# Patient Record
Sex: Female | Born: 1960 | Race: White | Hispanic: No | Marital: Married | Smoking: Never smoker
Health system: Southern US, Community
[De-identification: ages and names within clinical notes are randomized; demographics above are authoritative.]

## PROBLEM LIST (undated history)

## (undated) DIAGNOSIS — F329 Major depressive disorder, single episode, unspecified: Secondary | ICD-10-CM

## (undated) DIAGNOSIS — G4730 Sleep apnea, unspecified: Secondary | ICD-10-CM

## (undated) DIAGNOSIS — F419 Anxiety disorder, unspecified: Secondary | ICD-10-CM

## (undated) DIAGNOSIS — F32A Depression, unspecified: Secondary | ICD-10-CM

## (undated) DIAGNOSIS — Z862 Personal history of diseases of the blood and blood-forming organs and certain disorders involving the immune mechanism: Secondary | ICD-10-CM

## (undated) DIAGNOSIS — Z9889 Other specified postprocedural states: Secondary | ICD-10-CM

## (undated) DIAGNOSIS — K5150 Left sided colitis without complications: Secondary | ICD-10-CM

## (undated) DIAGNOSIS — H3320 Serous retinal detachment, unspecified eye: Secondary | ICD-10-CM

## (undated) DIAGNOSIS — H269 Unspecified cataract: Secondary | ICD-10-CM

## (undated) DIAGNOSIS — Z86010 Personal history of colonic polyps: Secondary | ICD-10-CM

## (undated) DIAGNOSIS — T7840XA Allergy, unspecified, initial encounter: Secondary | ICD-10-CM

## (undated) DIAGNOSIS — D649 Anemia, unspecified: Secondary | ICD-10-CM

## (undated) DIAGNOSIS — I10 Essential (primary) hypertension: Secondary | ICD-10-CM

## (undated) DIAGNOSIS — M1990 Unspecified osteoarthritis, unspecified site: Secondary | ICD-10-CM

## (undated) DIAGNOSIS — T8859XA Other complications of anesthesia, initial encounter: Secondary | ICD-10-CM

## (undated) DIAGNOSIS — L309 Dermatitis, unspecified: Secondary | ICD-10-CM

## (undated) HISTORY — DX: Essential (primary) hypertension: I10

## (undated) HISTORY — DX: Sleep apnea, unspecified: G47.30

## (undated) HISTORY — DX: Unspecified osteoarthritis, unspecified site: M19.90

## (undated) HISTORY — DX: Dermatitis, unspecified: L30.9

## (undated) HISTORY — DX: Allergy, unspecified, initial encounter: T78.40XA

## (undated) HISTORY — PX: EYE SURGERY: SHX253

## (undated) HISTORY — DX: Unspecified cataract: H26.9

## (undated) HISTORY — DX: Left sided colitis without complications: K51.50

## (undated) HISTORY — DX: Anxiety disorder, unspecified: F41.9

## (undated) HISTORY — DX: Serous retinal detachment, unspecified eye: H33.20

## (undated) HISTORY — DX: Depression, unspecified: F32.A

## (undated) HISTORY — DX: Personal history of colonic polyps: Z86.010

## (undated) HISTORY — PX: COLONOSCOPY: SHX174

## (undated) HISTORY — DX: Personal history of diseases of the blood and blood-forming organs and certain disorders involving the immune mechanism: Z86.2

## (undated) HISTORY — DX: Major depressive disorder, single episode, unspecified: F32.9

## (undated) HISTORY — DX: Anemia, unspecified: D64.9

## (undated) HISTORY — PX: POLYPECTOMY: SHX149

---
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
## 2001-06-26 DIAGNOSIS — K5150 Left sided colitis without complications: Secondary | ICD-10-CM

## 2001-06-26 HISTORY — DX: Left sided colitis without complications: K51.50

## 2002-05-30 ENCOUNTER — Encounter: Payer: Self-pay | Admitting: Internal Medicine

## 2002-05-30 DIAGNOSIS — K5150 Left sided colitis without complications: Secondary | ICD-10-CM

## 2004-07-13 ENCOUNTER — Ambulatory Visit: Payer: Self-pay | Admitting: Internal Medicine

## 2004-07-22 ENCOUNTER — Ambulatory Visit: Payer: Self-pay | Admitting: Internal Medicine

## 2005-08-08 ENCOUNTER — Ambulatory Visit: Payer: Self-pay | Admitting: Internal Medicine

## 2006-07-31 ENCOUNTER — Ambulatory Visit: Payer: Self-pay | Admitting: Internal Medicine

## 2006-07-31 LAB — CONVERTED CEMR LAB
Basophils Relative: 0.8 % (ref 0.0–1.0)
Eosinophils Relative: 2.2 % (ref 0.0–5.0)
HCT: 35.7 % — ABNORMAL LOW (ref 36.0–46.0)
Hemoglobin: 12.6 g/dL (ref 12.0–15.0)
Lymphocytes Relative: 12.1 % (ref 12.0–46.0)
Monocytes Absolute: 0.7 10*3/uL (ref 0.2–0.7)
Neutro Abs: 7.4 10*3/uL (ref 1.4–7.7)
Neutrophils Relative %: 77.9 % — ABNORMAL HIGH (ref 43.0–77.0)
RDW: 12 % (ref 11.5–14.6)
WBC: 9.5 10*3/uL (ref 4.5–10.5)

## 2006-09-11 ENCOUNTER — Ambulatory Visit: Payer: Self-pay | Admitting: Internal Medicine

## 2007-09-20 DIAGNOSIS — M129 Arthropathy, unspecified: Secondary | ICD-10-CM | POA: Insufficient documentation

## 2007-09-20 DIAGNOSIS — J45909 Unspecified asthma, uncomplicated: Secondary | ICD-10-CM | POA: Insufficient documentation

## 2007-09-20 DIAGNOSIS — L259 Unspecified contact dermatitis, unspecified cause: Secondary | ICD-10-CM

## 2007-09-23 ENCOUNTER — Ambulatory Visit: Payer: Self-pay | Admitting: Internal Medicine

## 2008-07-15 ENCOUNTER — Encounter: Payer: Self-pay | Admitting: Internal Medicine

## 2008-10-26 ENCOUNTER — Telehealth: Payer: Self-pay | Admitting: Internal Medicine

## 2008-10-27 ENCOUNTER — Ambulatory Visit: Payer: Self-pay | Admitting: Internal Medicine

## 2008-10-29 LAB — CONVERTED CEMR LAB
Bilirubin Urine: NEGATIVE
Nitrite: NEGATIVE
Total Protein, Urine: NEGATIVE mg/dL
Urine Glucose: NEGATIVE mg/dL
pH: 6.5 (ref 5.0–8.0)

## 2008-11-06 ENCOUNTER — Telehealth: Payer: Self-pay | Admitting: Internal Medicine

## 2008-11-10 ENCOUNTER — Encounter: Payer: Self-pay | Admitting: Internal Medicine

## 2008-11-10 ENCOUNTER — Ambulatory Visit: Payer: Self-pay | Admitting: Internal Medicine

## 2008-11-10 HISTORY — PX: COLONOSCOPY: SHX5424

## 2008-11-14 ENCOUNTER — Encounter: Payer: Self-pay | Admitting: Internal Medicine

## 2009-02-10 ENCOUNTER — Ambulatory Visit: Payer: Self-pay | Admitting: Internal Medicine

## 2009-05-17 ENCOUNTER — Ambulatory Visit: Payer: Self-pay | Admitting: Internal Medicine

## 2009-10-05 ENCOUNTER — Telehealth: Payer: Self-pay | Admitting: Internal Medicine

## 2009-11-17 ENCOUNTER — Encounter (INDEPENDENT_AMBULATORY_CARE_PROVIDER_SITE_OTHER): Payer: Self-pay | Admitting: *Deleted

## 2010-02-04 ENCOUNTER — Encounter: Payer: Self-pay | Admitting: Internal Medicine

## 2010-02-04 LAB — CONVERTED CEMR LAB
Alkaline Phosphatase: 63 units/L
BUN: 23 mg/dL
Creatinine, Ser: 0.79 mg/dL
HCT: 40 %
TSH: 1.95 microintl units/mL
Total Bilirubin: 0.4 mg/dL

## 2010-05-09 ENCOUNTER — Ambulatory Visit: Payer: Self-pay | Admitting: Internal Medicine

## 2010-07-26 NOTE — Assessment & Plan Note (Signed)
Summary: yearly check up.////em    History of Present Illness Visit Type: Follow-up Visit Primary GI MD: Silvano Rusk MD [facility] Primary Provider: Kern Alberta, MD Chief Complaint: yearly f/u ulcerative colitis.  No current complaints. History of Present Illness:   50 yo ww with ulcerative colitis. Dx 2003. Had a flare last year and when she increased Asacol to 4.8 g/day and with transient Canasa has done well. no active symptoms from ulcerative colitis. No skin, joint, eye complaints except visual acuity decrease.   GI Review of Systems      Denies abdominal pain, acid reflux, belching, bloating, chest pain, dysphagia with liquids, dysphagia with solids, heartburn, loss of appetite, nausea, vomiting, vomiting blood, weight loss, and  weight gain.        Denies anal fissure, black tarry stools, change in bowel habit, constipation, diarrhea, diverticulosis, fecal incontinence, heme positive stool, hemorrhoids, irritable bowel syndrome, jaundice, light color stool, liver problems, rectal bleeding, and  rectal pain. Clinical Reports Reviewed:  Colonoscopy:  11/10/2008:  1) Left-sided ulcerative colitis with mild-moderate disease activity  BXS CONFIRM AND NO DYSPLASIA 2) Otherwise normal examination into to the terminal ileum BXS CONFIRM  Procedures Next Due Date:    Colonoscopy: 05/2012   Preventive Screening-Counseling & Management  Alcohol-Tobacco     Smoking Status: never     Smoking Cessation Counseling: no  Caffeine-Diet-Exercise     Diet Counseling: not indicated; diet is assessed to be healthy     Does Patient Exercise: yes     Type of exercise: walking  Comments: Lost 12# in 1 year with diet and exercise!    Current Medications (verified): 1)  Singulair 10 Mg Tabs (Montelukast Sodium) .... Take 1 Tab By Mouth At Bedtime 2)  Loratadine Allergy Relief 10 Mg Tbdp (Loratadine) .... Once Daily 3)  Multivitamins  Tabs (Multiple Vitamin) .... Take 1 Tablet By Mouth Once  A Day 4)  Calcium 600/vitamin D 600-400 Mg-Unit Tabs (Calcium Carbonate-Vitamin D) .... Take One By Mouth Two Times A Day 5)  Asacol 400 Mg Tbec (Mesalamine) .... Take 6 Tablets Two Times A Day 6)  Aleve 220 Mg Caps (Naproxen Sodium) .... As Needed 7)  Advair Diskus 100-50 Mcg/dose Misc (Fluticasone-Salmeterol) .... Two Times A Day 8)  Gas-X Extra Strength 125 Mg Caps (Simethicone) .... As Needed 9)  Canasa 1000 Mg Supp (Mesalamine) .... Insert One Suppository Into Rectum As Needed  Allergies (verified): 1)  Ceclor  Past History:  Past Medical History: Reviewed history from 10/27/2008 and no changes required. Allergic Rhinitis Depression Arthritis Asthma Ulcerative Colitis- Left sided dx 2003 Eczema  Past Surgical History: Reviewed history from 09/20/2007 and no changes required. Tonsillectomy Adenoidectomy  Family History: Family History of Colon Polyps:Father Family History of Diabetes: Father Family History of Heart Disease: Mother No FH of Colon Cancer: Family History of Breast Cancer: Maternal Grandmother  Social History: Occupation: Medical illustrator Patient has never smoked.  Two children, teen and adult. Married Alcohol Use - yes occ. Daily Caffeine Use -2 Illicit Drug Use - no Does Patient Exercise:  yes  Review of Systems       as per HPI  Vital Signs:  Patient profile:   50 year old female Height:      65 inches Weight:      168 pounds BMI:     28.06 BSA:     1.84 Pulse rate:   92 / minute Pulse rhythm:   regular BP sitting:   130 / 82  (  right arm)  Vitals Entered By: Madlyn Frankel CMA Deborra Medina) (May 09, 2010 8:33 AM)  Physical Exam  General:  Well developed, well nourished, no acute distress. Eyes:  no injection or icterus Mouth:  No deformity or lesions, dentition normal. Lungs:  Clear throughout to auscultation. Heart:  Regular rate and rhythm; no murmurs, rubs,  or bruits. Abdomen:  soft and nontender no HSM/mass BS+ Psych:   Alert and cooperative. Normal mood and affect.   Impression & Recommendations:  Problem # 1:  ULCERATIVE COLITIS, LEFT SIDED (ICD-556.5) Assessment Unchanged Diagnosed 2003, after prednisone initially has done well on Asacol, currently on 3.6 grams daily. CBC, CMET, TSH ok 01/2010 Colonoscopy 10/2008 with active disease (mild-mod changes) and Asacl increased to 4.8 g Canasa added 8/10 due to gas and looses stools. Using as needed and not now. She is doing well and will continue current therapy. We discussed pros/cons of follow-up endoscopic evaluation vs. using clinical signs and symptoms.  Have decided not to look at mucosa now. Routine screening colonoscopy 05/2012, sooner investigation for problems and could be flex sig.  Patient Instructions: 1)  Your Asacol was refilled for 1 year through [facility]. 2)  Please schedule a follow-up appointment in 1 year. 3)  Routine screening clonoscopy will be planned for 05/2012. 4)  Please schedule a follow-up appointment as needed for problems. 5)  Copy sent to : Amy Georgianne Fick, [facility] 6)  The medication list was reviewed and reconciled.  All changed / newly prescribed medications were explained.  A complete medication list was provided to the patient / caregiver.

## 2010-07-26 NOTE — Letter (Signed)
Summary: Colonoscopy-Changed to Office Visit Letter  [facility]  [address] [place], [place] [address]   Phone: [phone]  Fax: [phone]      Nov 17, 2009 MRN: [id number]   [name]  [address]   Dear [name],   According to our records, it is time for you to schedule a Colonoscopy. However, after reviewing your medical record, I feel that an office visit would be most appropriate to more completely evaluate you and determine your need for a repeat procedure.  Please call [phone] (option #2) at your convenience to schedule an office visit. If you have any questions, concerns, or feel that this letter is in error, we would appreciate your call.   Sincerely,  Gatha Mayer, M.D.  [facility] [phone]  Appended Document: Colonoscopy-Changed to Office Visit Letter Please see if you can contact her about following up I am thinking she may not need a full colonoscopy she does need a follow-up visit unless she is not coming back?  Appended Document: Colonoscopy-Changed to Office Visit Letter disregard she is coming 11/14  Appended Document: Colonoscopy-Changed to Office Visit Letter noted

## 2010-07-26 NOTE — Medication Information (Signed)
Summary: [facility]   Imported By: Bubba Hales 05/12/2010 09:28:11  _____________________________________________________________________  External Attachment:    Type:   Image     Comment:   External Document

## 2010-07-26 NOTE — Progress Notes (Signed)
Summary: returning call   Phone Note Call from Patient Call back at Home Phone [phone]   Caller: Patient Call For: Samarah Hogle Reason for Call: Refill Medication, Talk to Nurse Summary of Call: Patient states that she received a call from bcbs and our office for refills on her Asacol but she states that she does not need refills at this time. Initial call taken by: Ronalee Red,  October 05, 2009 3:15 PM  Follow-up for Phone Call        message left for pt to return call. Abelino Derrick CMA Deborra Medina)  October 05, 2009 3:25 PM   RC from pt..she is taking 12 tablets daily.  She will call when she needs refill.  Pt states pharmacy is requesting refill too early. Follow-up by: Abelino Derrick CMA Deborra Medina),  October 13, 2009 4:35 PM

## 2010-08-11 ENCOUNTER — Telehealth: Payer: Self-pay | Admitting: Internal Medicine

## 2010-08-17 NOTE — Progress Notes (Signed)
Summary: Medication   Phone Note Call from Patient Call back at [phone]   Caller: Patient Call For: Dr. Carlean Purl Reason for Call: Talk to Nurse Summary of Call: Needs a script for Asacol sent to pharmacy until she receives her script from [facility].......[facility] [phone] Initial call taken by: Webb Laws,  August 11, 2010 10:36 AM    Prescriptions: ASACOL 400 MG TBEC (MESALAMINE) Take 6 tablets two times a day  #1080 x 3   Entered by:   Randye Lobo NCMA   Authorized by:   Gatha Mayer MD, [facility]   Signed by:   Randye Lobo NCMA on 08/11/2010   Method used:   Electronically to        [facility] [address] (retail)       [address] [place]  [address]       Ph: [phone] or [phone]       Fax: [phone]   RxID:   [id number]

## 2010-09-16 ENCOUNTER — Other Ambulatory Visit: Payer: Self-pay | Admitting: *Deleted

## 2010-09-16 MED ORDER — MESALAMINE 400 MG PO TBEC: DELAYED_RELEASE_TABLET | ORAL | Status: DC

## 2010-11-08 NOTE — Assessment & Plan Note (Signed)
[facility] OFFICE NOTE   NAME:[name], Wisdom                       MRN:          [id number]  DATE:09/23/2007                            DOB:          06-Nov-1960    CHIEF COMPLAINT:  Follow up of left-sided ulcerative colitis.   PROBLEMS:  1. Left-sided ulcerative colitis diagnosed 2003, initial colonoscopy      then.  2. Asthma.  3. Depression on Wellbutrin.  4. Eczema.  5. Allergic rhinitis.  6. Arthralgia/osteoarthritis in the past.   PAST SURGICAL HISTORY:  Tonsillectomy and adenoidectomy.   INTERVAL HISTORY:  [name] is doing well without any significant abdominal  pain, diarrhea or rectal bleeding.  She continues on 9 Asacol a day, and  has not needed any other adjunct therapy in the interim since her last  year's visit.  She needs a refill on her Asacol.  She needs to go back  and have her cholesterol rechecked with Kassie Mends, PAC, because  apparently those numbers were up somewhat.  She has gained 10 pounds in  the last year.  She says her weight is up and down some.  She is  thinking about coming off the Effexor or changing to something  different.  She remains a non-smoker.  There is no significant change in  her social history reported at this time.   MEDICATIONS:  Remain Advair, Singulair, Clarinex, multivitamin,  Wellbutrin, calcium with vitamin D, Asacol as described.   SHE IS ALLERGIC TO CECLOR.   PRN MEDICATIONS:  Include Aleve and Canasa suppositories as needed.   PHYSICAL EXAMINATION:  Weight 191 pounds.  Pulse 68.  Blood pressure  112/76.  EYES:  Anicteric.  ABDOMEN:  Soft and nontender.  No organomegaly.  She is alert and oriented x3.   ASSESSMENT:  Left-sided ulcerative colitis, stable.   PLAN:  1. Refill Asacol for a year.  2. Return visit in a year.  3. Colonoscopy around December 2011 would be the earliest, somewhere      in that time frame, 8 to 10 years from her  diagnosis, otherwise at      age 51.  She has just had left-sided ulcerative colitis, so she      could actually go 10 to 15 years prior to the colonoscopy, probably      due around by the time she is 51.  4. I have asked her to look in to having her vitamin D level checked      with her primary care [name], and consider a bone density study      according to routine screening guidelines.  I do not think her left-      sided ulcerative colitis predisposes her significantly to      osteoporosis, and she has no significant steroid exposure in the      past.  In fact, she has had none.     Gatha Mayer, MD,FACG  Electronically Signed    CEG/MedQ  DD: 09/23/2007  DT: 09/23/2007  Job #: [id number]   cc:   Kassie Mends, P.A.C.

## 2010-11-11 NOTE — Assessment & Plan Note (Signed)
[name]   NAME:[name], [name]                       MRN:          [id number]  DATE:07/31/2006                            DOB:          1961-02-05    CHIEF COMPLAINT:  Followup of ulcerative colitis with flare.   She is having some rectal bleeding for the past few weeks.  Some loose  stools that are urgent and small volume.  There is bilateral lower  quadrant cramps.  She had 1 day maybe of some subjective fever and body  aches.  She thinks she may have had a gastroenteritis in the middle of  all this.  I do not think she vomited.  There is no genitourinary  complaint.  I had seen her a year ago, she has gained about 5-6 pounds  in the last year.   CURRENT MEDICATIONS:  Are listed and reviewed in the chart.  As far as  her left-sided ulcerative colitis she has been on the Asacol 6 tablets a  day, 3 in the morning, 3 in the evening.  She had used a few  suppositories, she had expired Rowasa enemas so she did not take those.  There is no rectal pain.   DRUG ALLERGIES:  CECLOR.   PAST MEDICAL HISTORY:  Reviewed and unchanged from previous, there is  nothing new.   PHYSICAL EXAMINATION:  Reveals a well developed, overweight, middle-aged  white woman in no acute distress.  ABDOMEN:  Soft, minimally tender in the lower quadrants without  organomegaly or mass.  Inspection of the rectal area shows no perianal rash or abnormalities.   ASSESSMENT:  Left-sided ulcerative colitis with flare.   PLAN:  1. Check CBC with diff, increase Asacol to 9 tablets a day, split      those, 3 month prescription printed for the patient.  2. Add Rowasa enema nightly for 2 weeks.  If that solves things,      switch over to Canasa suppository, if it does not, continue the      Rowasa until she follows up in 6 weeks.  Prescriptions for both      given.  3. She is to call back if things do not improve.     Gatha Mayer, MD,FACG  Electronically Signed    CEG/MedQ  DD: 07/31/2006  DT: 07/31/2006  Job #: [id number]

## 2010-11-11 NOTE — Assessment & Plan Note (Signed)
[facility] OFFICE NOTE   NAME:Marie, Combs                       MRN:          [id number]  DATE:09/11/2006                            DOB:          1961-04-17    CHIEF COMPLAINT:  Followup of ulcerative colitis.   She had had a flare. She increased her Asacol to 9 tablets a day taking  b.i.d. I put her on a Rowasa enema and then Canasa suppositories. No  more diarrhea, no more bleeding. She is using the Canasa suppositories  most nights but not all. See worksheet for other details. Her weight is  181 pounds, pulse 96, blood pressure 124/76.  Medications are listed and reviewed.   ASSESSMENT:  Left-sided ulcerative colitis under control at this time.   PLAN:  Continue Asacol at current dose and she can stop using the Canasa  and add back if she has bleeding or diarrhea. She should see me within 6-  12 months otherwise.     Gatha Mayer, MD,FACG  Electronically Signed    CEG/MedQ  DD: 09/12/2006  DT: 09/13/2006  Job #: [id number]   cc:   Kassie Mends, PA-C

## 2011-05-30 ENCOUNTER — Ambulatory Visit (INDEPENDENT_AMBULATORY_CARE_PROVIDER_SITE_OTHER): Payer: BC Managed Care – PPO | Admitting: Internal Medicine

## 2011-05-30 ENCOUNTER — Encounter: Payer: Self-pay | Admitting: Internal Medicine

## 2011-05-30 VITALS — BP 132/78 | HR 84 | Ht 65.0 in | Wt 172.0 lb

## 2011-05-30 DIAGNOSIS — K5150 Left sided colitis without complications: Secondary | ICD-10-CM

## 2011-05-30 MED ORDER — MESALAMINE 400 MG PO TBEC: DELAYED_RELEASE_TABLET | ORAL | Status: DC

## 2011-05-30 MED ORDER — MESALAMINE 1000 MG RE SUPP: 1000.0000 mg | RECTAL | Status: DC | PRN

## 2011-05-30 NOTE — Patient Instructions (Signed)
Your prescriptions have been sent to your maill order pharmacy for Canasa and Asacol. Dr. Carlean Purl will see you in 1 year at your Colonoscopy appointment. You will be contacted around November 2013 to schedule that.

## 2011-05-30 NOTE — Assessment & Plan Note (Signed)
Doing well, continue mesalamine 4.8 g total orally and intermittent rectal mesalamine in the form of Canasa as needed. Note that comprehensive metabolic panel and TSH from September 2012 are normal.

## 2011-05-30 NOTE — Progress Notes (Signed)
  Subjective:    Patient ID: [name], female    DOB: March 20, 1961, 50 y.o.   MRN: [id number]  HPI [name] presents today for routine followup of her left-sided ulcer colitis. Other than a little bit of diarrhea back in the summer, she has done well without significant diarrhea, rectal bleeding or abdominal pain on her current dose of mesalamine. She occasionally uses her Canasa suppositories. She had her primary care physician today things are going well there she brings metabolic panel, lipids and a TSH which are normal. Allergies  Allergen Reactions  . Cefaclor    Outpatient Prescriptions Prior to Visit  Medication Sig Dispense Refill  . Calcium Carbonate-Vitamin D (CALCIUM 600+D) 600-400 MG-UNIT per tablet Take 1 tablet by mouth 2 (two) times daily.        . Fluticasone-Salmeterol (ADVAIR) 100-50 MCG/DOSE AEPB Inhale 1 puff into the lungs 2 (two) times daily.        Marland Kitchen loratadine (LORATADINE ALLERGY RELIEF) 10 MG dissolvable tablet Take 10 mg by mouth daily.        . montelukast (SINGULAIR) 10 MG tablet Take 10 mg by mouth at bedtime.        . Multiple Vitamin (MULTIVITAMIN) tablet Take 1 tablet by mouth daily.        . Simethicone (GAS-X EXTRA STRENGTH) 125 MG CAPS Take by mouth as needed.        . mesalamine (ASACOL) 400 MG EC tablet Take six tablets by mouth two times a day  1080 tablet  2  . mesalamine (CANASA) 1000 MG suppository Place 1,000 mg rectally as needed.         Past Medical History  Diagnosis Date  . Allergic rhinitis   . Depression   . Arthritis   . Asthma   . Ulcerative colitis, left sided 2003  . Eczema   . History of iron deficiency anemia    Past Surgical History  Procedure Date  . Tonsillectomy and adenoidectomy   . Colonoscopy 11/10/2008    ulcerative colitis   History   Social History  . Marital Status: Married    Spouse Name: N/A    Number of Children: 2  . Years of Education: N/A   Occupational History  . insurance agent    Social  History Main Topics  . Smoking status: Never Smoker   . Smokeless tobacco: Never Used  . Alcohol Use: Yes     occ  . Drug Use: No  .                  Family History  Problem Relation Age of Onset  . Colon polyps Father   . Diabetes Father   . Heart disease Mother   . Breast cancer Maternal Grandmother          Review of Systems As above.    Objective:   Physical Exam General:  NAD Eyes: anicteric Lungs: clear Heart: S1S2 no rubs, murmurs or gallops Abdomen: soft and nontender, BS+ Ext: no edema          Assessment & Plan:

## 2011-12-25 ENCOUNTER — Other Ambulatory Visit: Payer: Self-pay | Admitting: Internal Medicine

## 2011-12-25 MED ORDER — MESALAMINE 400 MG PO CPDR: 2400.0000 mg | DELAYED_RELEASE_CAPSULE | Freq: Two times a day (BID) | ORAL | Status: DC

## 2011-12-25 NOTE — Telephone Encounter (Signed)
Pt request the replacement for Asacol be sent to [facility] in [place] for 3 mos. [place].  I informed her that Asacol is now Delzicol.

## 2012-02-13 ENCOUNTER — Telehealth: Payer: Self-pay | Admitting: Internal Medicine

## 2012-02-13 MED ORDER — MESALAMINE 400 MG PO CPDR: 2400.0000 mg | DELAYED_RELEASE_CAPSULE | Freq: Two times a day (BID) | ORAL | Status: DC

## 2012-02-13 NOTE — Telephone Encounter (Signed)
LM on pts cell# stating that her Delzicol has been sent in to the [facility] per her request.  Reminded her to call back and set up an appointment for Nov/Dec. 2013

## 2012-04-08 ENCOUNTER — Encounter: Payer: Self-pay | Admitting: Internal Medicine

## 2012-05-10 ENCOUNTER — Ambulatory Visit (AMBULATORY_SURGERY_CENTER): Payer: BC Managed Care – PPO | Admitting: *Deleted

## 2012-05-10 ENCOUNTER — Encounter: Payer: Self-pay | Admitting: Internal Medicine

## 2012-05-10 VITALS — Ht 65.0 in | Wt 175.0 lb

## 2012-05-10 DIAGNOSIS — K5150 Left sided colitis without complications: Secondary | ICD-10-CM

## 2012-05-10 DIAGNOSIS — Z1211 Encounter for screening for malignant neoplasm of colon: Secondary | ICD-10-CM

## 2012-05-10 MED ORDER — SUPREP BOWEL PREP KIT 17.5-3.13-1.6 GM/177ML PO SOLN: ORAL | Status: DC

## 2012-06-05 ENCOUNTER — Encounter: Payer: Self-pay | Admitting: Internal Medicine

## 2012-06-05 ENCOUNTER — Ambulatory Visit (AMBULATORY_SURGERY_CENTER): Payer: BC Managed Care – PPO | Admitting: Internal Medicine

## 2012-06-05 VITALS — BP 123/77 | HR 86 | Temp 97.9°F | Resp 12 | Ht 65.0 in | Wt 175.0 lb

## 2012-06-05 DIAGNOSIS — Z86010 Personal history of colonic polyps: Secondary | ICD-10-CM

## 2012-06-05 DIAGNOSIS — D126 Benign neoplasm of colon, unspecified: Secondary | ICD-10-CM

## 2012-06-05 DIAGNOSIS — Z1211 Encounter for screening for malignant neoplasm of colon: Secondary | ICD-10-CM

## 2012-06-05 DIAGNOSIS — K5190 Ulcerative colitis, unspecified, without complications: Secondary | ICD-10-CM

## 2012-06-05 DIAGNOSIS — Z860101 Personal history of adenomatous and serrated colon polyps: Secondary | ICD-10-CM

## 2012-06-05 DIAGNOSIS — K5150 Left sided colitis without complications: Secondary | ICD-10-CM

## 2012-06-05 HISTORY — DX: Personal history of adenomatous and serrated colon polyps: Z86.0101

## 2012-06-05 HISTORY — DX: Personal history of colonic polyps: Z86.010

## 2012-06-05 MED ORDER — SODIUM CHLORIDE 0.9 % IV SOLN: 500.0000 mL | INTRAVENOUS | Status: DC

## 2012-06-05 NOTE — Progress Notes (Signed)
Patient did not experience any of the following events: a burn prior to discharge; a fall within the facility; wrong site/side/patient/procedure/implant event; or a hospital transfer or hospital admission upon discharge from the facility. (G8907) Patient did not have preoperative order for IV antibiotic SSI prophylaxis. (G8918)  

## 2012-06-05 NOTE — Progress Notes (Signed)
Called to room to assist during endoscopic procedure.  Patient ID and intended procedure confirmed with present staff. Received instructions for my participation in the procedure from the performing physician.  

## 2012-06-05 NOTE — Patient Instructions (Addendum)
One small polyp was removed. It looks benign. Your colitis looks like it is under control. I will let you know biopsy results and plans. Please make sure to have your kidney function (BUN and creatinine) checked this month or next (primary care can do) since you take mesalamine and that can rarely damage the kidneys (extremely rare). An annual urinalysis is also appropriate. I would like you to have your primary care send me those results also.   Thank you for choosing me and [facility].  Gatha Mayer, MD, FACG  YOU HAD AN ENDOSCOPIC PROCEDURE TODAY AT [facility]: Refer to the procedure report that was given to you for any specific questions about what was found during the examination.  If the procedure report does not answer your questions, please call your gastroenterologist to clarify.  If you requested that your care partner not be given the details of your procedure findings, then the procedure report has been included in a sealed envelope for you to review at your convenience later.  YOU SHOULD EXPECT: Some feelings of bloating in the abdomen. Passage of more gas than usual.  Walking can help get rid of the air that was put into your GI tract during the procedure and reduce the bloating. If you had a lower endoscopy (such as a colonoscopy or flexible sigmoidoscopy) you may notice spotting of blood in your stool or on the toilet paper. If you underwent a bowel prep for your procedure, then you may not have a normal bowel movement for a few days.  DIET: Your first meal following the procedure should be a light meal and then it is ok to progress to your normal diet.  A half-sandwich or bowl of soup is an example of a good first meal.  Heavy or fried foods are harder to digest and may make you feel nauseous or bloated.  Likewise meals heavy in dairy and vegetables can cause extra gas to form and this can also increase the bloating.  Drink plenty of fluids but you  should avoid alcoholic beverages for 24 hours.  ACTIVITY: Your care partner should take you home directly after the procedure.  You should plan to take it easy, moving slowly for the rest of the day.  You can resume normal activity the day after the procedure however you should NOT DRIVE or use heavy machinery for 24 hours (because of the sedation medicines used during the test).    SYMPTOMS TO REPORT IMMEDIATELY: A gastroenterologist can be reached at any hour.  During normal business hours, 8:30 AM to 5:00 PM Monday through Friday, call [phone].  After hours and on weekends, please call the GI answering service at [phone] who will take a message and have the physician on call contact you.   Following lower endoscopy (colonoscopy or flexible sigmoidoscopy):  Excessive amounts of blood in the stool  Significant tenderness or worsening of abdominal pains  Swelling of the abdomen that is new, acute  Fever of 100F or higher   FOLLOW UP: If any biopsies were taken you will be contacted by phone or by letter within the next 1-3 weeks.  Call your gastroenterologist if you have not heard about the biopsies in 3 weeks.  Our staff will call the home number listed on your records the next business day following your procedure to check on you and address any questions or concerns that you may have at that time regarding the information given to you  following your procedure. This is a courtesy call and so if there is no answer at the home number and we have not heard from you through the emergency physician on call, we will assume that you have returned to your regular daily activities without incident.  SIGNATURES/CONFIDENTIALITY: You and/or your care partner have signed paperwork which will be entered into your electronic medical record.  These signatures attest to the fact that that the information above on your After Visit Summary has been reviewed and is understood.  Full responsibility  of the confidentiality of this discharge information lies with you and/or your care-partner.

## 2012-06-05 NOTE — Op Note (Signed)
[place]  [address] [place], [id number]   COLONOSCOPY PROCEDURE REPORT  PATIENT: [name], [name]  MR#: [id number] BIRTHDATE: [birth year] , 51  yrs. old GENDER: Female ENDOSCOPIST: Gatha Mayer, MD, [facility] PROCEDURE DATE:  06/05/2012 PROCEDURE:   Colonoscopy with snare polypectomy and Colonoscopy with biopsy ASA CLASS:   Class II INDICATIONS:elevated risk screening. MEDICATIONS: MAC sedation, administered by CRNA, These medications were titrated to patient response per physician's verbal order, and propofol (Diprivan) 495m IV  DESCRIPTION OF PROCEDURE:   After the risks benefits and alternatives of the procedure were thoroughly explained, informed consent was obtained.  A digital rectal exam revealed no abnormalities of the rectum.   The LB [id number]-H180AL [id number] endoscope was introduced through the anus and advanced to the cecum, which was identified by both the appendix and ileocecal valve. No adverse events experienced.   The quality of the prep was Suprep excellent The instrument was then slowly withdrawn as the colon was fully examined.      COLON FINDINGS: A diminutive polypoid shaped sessile polyp was found at the appendiceal orifice.  A polypectomy was performed with a cold snare.  The resection was complete and the polyp tissue was completely retrieved.   Mild erythematous was found in the sigmoid colon.  Multiple biopsies were performed using cold forceps.   The colon mucosa was otherwise normal.  Retroflexed views revealed internal hemorrhoids. The time to cecum=4 minutes 15 seconds. Withdrawal time=12 minutes 42 seconds.  The scope was withdrawn and the procedure completed. COMPLICATIONS: There were no complications.  ENDOSCOPIC IMPRESSION: 1.   Diminutive sessile polyp was found at the appendiceal orifice; polypectomy was performed with a cold snare 2.   Mild erythematous was found in the sigmoid colon; multiple biopsies were performed  using cold forceps as part of UC screening 3.   The colon mucosa was otherwise normal - left colon biopsies taken. UC looks in remission.  RECOMMENDATIONS: Timing of repeat colonoscopy will be determined by pathology findings. Need to see results of annual BUN/creatinine (CMET) and UA from PCP Continue mesalamine 4.8 g daily   eSigned:  CGatha Mayer MD, [facility]12/04/2012 10:36 AM   cc: The Patient and TGar Ponto MD

## 2012-06-06 ENCOUNTER — Telehealth: Payer: Self-pay | Admitting: *Deleted

## 2012-06-06 NOTE — Telephone Encounter (Signed)
  Follow up Call-  Call back number 06/05/2012  Post procedure Call Back phone  # [phone]  Permission to leave phone message Yes     Patient questions:  Do you have a fever, pain , or abdominal swelling? no Pain Score  0 *  Have you tolerated food without any problems? yes  Have you been able to return to your normal activities? yes  Do you have any questions about your discharge instructions: Diet   no Medications  no Follow up visit  no  Do you have questions or concerns about your Care? no  Actions: * If pain score is 4 or above: No action needed, pain <4.

## 2012-06-11 ENCOUNTER — Encounter: Payer: Self-pay | Admitting: Internal Medicine

## 2012-06-11 NOTE — Progress Notes (Signed)
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

## 2012-10-28 ENCOUNTER — Other Ambulatory Visit: Payer: Self-pay | Admitting: Internal Medicine

## 2013-05-26 HISTORY — PX: CATARACT EXTRACTION: SUR2

## 2013-10-24 ENCOUNTER — Other Ambulatory Visit: Payer: Self-pay | Admitting: Internal Medicine

## 2014-03-29 ENCOUNTER — Other Ambulatory Visit: Payer: Self-pay | Admitting: Internal Medicine

## 2014-07-11 ENCOUNTER — Other Ambulatory Visit: Payer: Self-pay | Admitting: Internal Medicine

## 2014-07-12 MED ORDER — MESALAMINE 400 MG PO CPDR: DELAYED_RELEASE_CAPSULE | ORAL | Status: DC

## 2014-08-31 ENCOUNTER — Ambulatory Visit: Payer: Self-pay | Admitting: Internal Medicine

## 2014-09-21 ENCOUNTER — Other Ambulatory Visit: Payer: Self-pay | Admitting: Internal Medicine

## 2014-10-08 ENCOUNTER — Ambulatory Visit: Payer: Self-pay | Admitting: Internal Medicine

## 2014-11-16 ENCOUNTER — Ambulatory Visit (INDEPENDENT_AMBULATORY_CARE_PROVIDER_SITE_OTHER): Payer: BLUE CROSS/BLUE SHIELD | Admitting: Internal Medicine

## 2014-11-16 ENCOUNTER — Encounter: Payer: Self-pay | Admitting: Internal Medicine

## 2014-11-16 VITALS — BP 142/92 | HR 76 | Ht 64.5 in | Wt 199.0 lb

## 2014-11-16 DIAGNOSIS — K5150 Left sided colitis without complications: Secondary | ICD-10-CM | POA: Diagnosis not present

## 2014-11-16 DIAGNOSIS — K602 Anal fissure, unspecified: Secondary | ICD-10-CM

## 2014-11-16 DIAGNOSIS — Z86010 Personal history of colonic polyps: Secondary | ICD-10-CM

## 2014-11-16 MED ORDER — BUDESONIDE 3 MG PO CPEP: 9.0000 mg | ORAL_CAPSULE | Freq: Every day | ORAL | Status: DC

## 2014-11-16 MED ORDER — DILTIAZEM GEL 2 %: 1.0000 "application " | Freq: Two times a day (BID) | CUTANEOUS | Status: DC

## 2014-11-16 NOTE — Progress Notes (Signed)
   Subjective:    Patient ID: [name], female    DOB: [birth year], 54 y.o.   MRN: [id number] Cc: ulcerative coituis HPI Very nice woman followed for UC - left siided. Maintained on mesalamine. Suffered loss of daughter age early 11's last Fall and has had some cramps, diarrhea, mucous and slight bleeding. Gradually getting better emotionally but still struggling. Has gained weight. Having some pain with and after defecation also, x few months.   Medications, allergies, past medical history, past surgical history, family history and social history are reviewed and updated in the EMR.  Review of Systems As above    Objective:   Physical Exam @BP  142/92 mmHg  Pulse 76  Ht 5' 4.5" (1.638 m)  Wt 199 lb (90.266 kg)  BMI 33.64 kg/m2  LMP 05/18/2014@  General:  NAD Eyes:   anicteric Lungs:  clear Heart::  S1S2 no rubs, murmurs or gallops Abdomen:  soft and nontender, BS+ Ext:   no edema, cyanosis or clubbing   Rectal: Patti Martinique, CMA present.  Anoderm inspection revealed small tags No mass or rectocele present. Mildly tender posterior with induration c/w fissure + moderate spasm.    Data Reviewed:  CBC ,CMET, Lipids, TSH from PCP - 11/03/14 - all ok except BUN 24, glucose 103     Assessment & Plan:    ULCERATIVE COLITIS, LEFT SIDED Try Entocort for 2 months to see if improves sxs Uceris not on formulary Reassess 2 months Colonoscopy will be due 2016-17 (late - early)   Anal fissure Etiology reviewed Control diarrhea/UC Diltiazem gel Local care RTC 2 months   Hx of adenomatous polyp of colon Colonoscopy late 2016, early 2017   WT:UUEKCM, TERRY, MD

## 2014-11-16 NOTE — Patient Instructions (Addendum)
We have sent the following medications to your pharmacy for you to pick up at your convenience: Generic Entocort  We are sending Diltiazem gel to [facility] in the [employer].    Follow up with [place] in 2 months, appointment made for 01/18/15 at 8:45AM.    I appreciate the opportunity to care for you. Silvano Rusk, M.D., [facility]

## 2014-11-16 NOTE — Assessment & Plan Note (Addendum)
Try Entocort for 2 months to see if improves sxs Uceris not on formulary Reassess 2 months Colonoscopy will be due 2016-17 (late - early)

## 2014-11-18 ENCOUNTER — Encounter: Payer: Self-pay | Admitting: Internal Medicine

## 2014-11-18 DIAGNOSIS — K602 Anal fissure, unspecified: Secondary | ICD-10-CM | POA: Insufficient documentation

## 2014-11-18 NOTE — Assessment & Plan Note (Signed)
Etiology reviewed Control diarrhea/UC Diltiazem gel Local care RTC 2 months

## 2014-11-18 NOTE — Assessment & Plan Note (Signed)
Colonoscopy late 2016, early 2017

## 2014-12-14 ENCOUNTER — Encounter: Payer: Self-pay | Admitting: Internal Medicine

## 2014-12-19 ENCOUNTER — Other Ambulatory Visit: Payer: Self-pay | Admitting: Internal Medicine

## 2015-01-18 ENCOUNTER — Encounter: Payer: Self-pay | Admitting: Internal Medicine

## 2015-01-18 ENCOUNTER — Ambulatory Visit (INDEPENDENT_AMBULATORY_CARE_PROVIDER_SITE_OTHER): Payer: BLUE CROSS/BLUE SHIELD | Admitting: Internal Medicine

## 2015-01-18 VITALS — BP 130/86 | HR 82 | Ht 65.0 in | Wt 205.6 lb

## 2015-01-18 DIAGNOSIS — K5150 Left sided colitis without complications: Secondary | ICD-10-CM | POA: Diagnosis not present

## 2015-01-18 DIAGNOSIS — K602 Anal fissure, unspecified: Secondary | ICD-10-CM | POA: Diagnosis not present

## 2015-01-18 NOTE — Progress Notes (Signed)
   Subjective:    Patient ID: [name], female    DOB: October 01, 1960, 54 y.o.   MRN: [id number] Chief complaint: Follow-up anal fissure and also of colitis HPI  The patient is here, her diarrhea problems she was having a couple months ago her gone. She is off the budesonide after 2 months of treatment. 9 mg daily. Anal fissure is much better as well she is using the diltiazem gel though not every day twice a day she has used it and is refilling it. Overall considerably better and she would like to schedule follow-up colonoscopy as we discussed in May. Medications, allergies, past medical history, past surgical history, family history and social history are reviewed and updated in the EMR.  Review of Systems As above    Objective:   Physical Exam BP 130/86 mmHg  Pulse 82  Ht 5' 5"  (1.651 m)  Wt 205 lb 9.6 oz (93.26 kg)  BMI 34.21 kg/m2 No acute distress Inspection of the anal area shows a small anterior tag but no evidence of fissure or other problems. June McMurray CMA present.      Assessment & Plan:  ULCERATIVE COLITIS, LEFT SIDED Time to reassess w/ colonoscopy DC budesonide  Anal fissure Symptomatically improved  The risks and benefits as well as alternatives of endoscopic procedure(s) have been discussed and reviewed. All questions answered. The patient agrees to proceed.   Current outpatient prescriptions:  .  albuterol (PROVENTIL HFA;VENTOLIN HFA) 108 (90 BASE) MCG/ACT inhaler, Inhale 2 puffs into the lungs every 6 (six) hours as needed. , Disp: , Rfl:  .  Calcium Carbonate-Vitamin D (CALCIUM 600+D) 600-400 MG-UNIT per tablet, Take 1 tablet by mouth 2 (two) times daily.  , Disp: , Rfl:  .  diltiazem 2 % GEL, Apply 1 application topically 2 (two) times daily., Disp: 30 g, Rfl: 2 .  Fluticasone-Salmeterol (ADVAIR) 100-50 MCG/DOSE AEPB, Inhale 1 puff into the lungs 2 (two) times daily.  , Disp: , Rfl:  .  loratadine (LORATADINE ALLERGY RELIEF) 10 MG dissolvable tablet,  Take 10 mg by mouth daily.  , Disp: , Rfl:  .  mesalamine (CANASA) 1000 MG suppository, Place 1 suppository (1,000 mg total) rectally as needed., Disp: 42 suppository, Rfl: 3 .  Mesalamine (DELZICOL) 400 MG CPDR DR capsule, Take 6 capsules twice a day, Disp: 1080 capsule, Rfl: 0 .  montelukast (SINGULAIR) 10 MG tablet, Take 10 mg by mouth at bedtime.  , Disp: , Rfl:  .  Multiple Vitamin (MULTIVITAMIN) tablet, Take 1 tablet by mouth daily.  , Disp: , Rfl:  .  Simethicone (GAS-X EXTRA STRENGTH) 125 MG CAPS, Take by mouth as needed.  , Disp: , Rfl:  .  clarithromycin (BIAXIN) 500 MG tablet, , Disp: , Rfl:  .  sertraline (ZOLOFT) 50 MG tablet, Take 1.5 tablets by mouth daily., Disp: , Rfl:   I appreciate the opportunity to care for this patient. CC: Gar Ponto, MD

## 2015-01-18 NOTE — Assessment & Plan Note (Signed)
Symptomatically improved

## 2015-01-18 NOTE — Patient Instructions (Signed)
You have been scheduled for a colonoscopy. Please follow written instructions given to you at your visit today.  Please pick up your prep supplies at the pharmacy within the next 1-3 days. If you use inhalers (even only as needed), please bring them with you on the day of your procedure.  Please discontinue taking Budesonide  I appreciate the opportunity to care for you. Gatha Mayer, MD, [facility]

## 2015-01-18 NOTE — Assessment & Plan Note (Signed)
Time to reassess w/ colonoscopy DC budesonide

## 2015-03-19 ENCOUNTER — Other Ambulatory Visit: Payer: Self-pay | Admitting: Internal Medicine

## 2015-03-29 ENCOUNTER — Encounter: Payer: BLUE CROSS/BLUE SHIELD | Admitting: Internal Medicine

## 2015-05-17 ENCOUNTER — Encounter: Payer: Self-pay | Admitting: Internal Medicine

## 2015-05-24 ENCOUNTER — Encounter: Payer: Self-pay | Admitting: Internal Medicine

## 2015-05-24 ENCOUNTER — Ambulatory Visit (AMBULATORY_SURGERY_CENTER): Payer: BLUE CROSS/BLUE SHIELD | Admitting: Internal Medicine

## 2015-05-24 VITALS — BP 151/91 | HR 74 | Temp 97.8°F | Resp 16 | Ht 65.0 in | Wt 205.0 lb

## 2015-05-24 DIAGNOSIS — K5150 Left sided colitis without complications: Secondary | ICD-10-CM | POA: Diagnosis present

## 2015-05-24 DIAGNOSIS — Z86010 Personal history of colonic polyps: Secondary | ICD-10-CM | POA: Diagnosis not present

## 2015-05-24 DIAGNOSIS — K529 Noninfective gastroenteritis and colitis, unspecified: Secondary | ICD-10-CM | POA: Diagnosis not present

## 2015-05-24 MED ORDER — SODIUM CHLORIDE 0.9 % IV SOLN: 500.0000 mL | INTRAVENOUS | Status: DC

## 2015-05-24 NOTE — Progress Notes (Signed)
Called to room to assist during endoscopic procedure.  Patient ID and intended procedure confirmed with present staff. Received instructions for my participation in the procedure from the performing physician.  

## 2015-05-24 NOTE — Patient Instructions (Addendum)
There was some mild inflammation in the rectum and sigmoid colon - nothing bad or suspicious.  I took biopsies and will let you know.  I think you should start back on the [place].  I appreciate the opportunity to care for you. Gatha Mayer, MD, [facility]     Discharge instructions given. Biopsies taken. Resume previous medications. YOU HAD AN ENDOSCOPIC PROCEDURE TODAY AT [facility]:   Refer to the procedure report that was given to you for any specific questions about what was found during the examination.  If the procedure report does not answer your questions, please call your gastroenterologist to clarify.  If you requested that your care partner not be given the details of your procedure findings, then the procedure report has been included in a sealed envelope for you to review at your convenience later.  YOU SHOULD EXPECT: Some feelings of bloating in the abdomen. Passage of more gas than usual.  Walking can help get rid of the air that was put into your GI tract during the procedure and reduce the bloating. If you had a lower endoscopy (such as a colonoscopy or flexible sigmoidoscopy) you may notice spotting of blood in your stool or on the toilet paper. If you underwent a bowel prep for your procedure, you may not have a normal bowel movement for a few days.  Please Note:  You might notice some irritation and congestion in your nose or some drainage.  This is from the oxygen used during your procedure.  There is no need for concern and it should clear up in a day or so.  SYMPTOMS TO REPORT IMMEDIATELY:   Following lower endoscopy (colonoscopy or flexible sigmoidoscopy):  Excessive amounts of blood in the stool  Significant tenderness or worsening of abdominal pains  Swelling of the abdomen that is new, acute  Fever of 100F or higher   For urgent or emergent issues, a gastroenterologist can be reached at any hour by calling [phone].   DIET:  Your first meal following the procedure should be a small meal and then it is ok to progress to your normal diet. Heavy or fried foods are harder to digest and may make you feel nauseous or bloated.  Likewise, meals heavy in dairy and vegetables can increase bloating.  Drink plenty of fluids but you should avoid alcoholic beverages for 24 hours.  ACTIVITY:  You should plan to take it easy for the rest of today and you should NOT DRIVE or use heavy machinery until tomorrow (because of the sedation medicines used during the test).    FOLLOW UP: Our staff will call the number listed on your records the next business day following your procedure to check on you and address any questions or concerns that you may have regarding the information given to you following your procedure. If we do not reach you, we will leave a message.  However, if you are feeling well and you are not experiencing any problems, there is no need to return our call.  We will assume that you have returned to your regular daily activities without incident.  If any biopsies were taken you will be contacted by phone or by letter within the next 1-3 weeks.  Please call us at [phone] if you have not heard about the biopsies in 3 weeks.    SIGNATURES/CONFIDENTIALITY: You and/or your care partner have signed paperwork which will be entered into your electronic medical record.  These  signatures attest to the fact that that the information above on your After Visit Summary has been reviewed and is understood.  Full responsibility of the confidentiality of this discharge information lies with you and/or your care-partner.

## 2015-05-24 NOTE — Op Note (Signed)
[place]  [address] [place], [id number]   COLONOSCOPY PROCEDURE REPORT  PATIENT: [name], [name]  MR#: [id number] BIRTHDATE: 1961/04/26 , 54  yrs. old GENDER: female ENDOSCOPIST: Gatha Mayer, MD, [facility] PROCEDURE DATE:  05/24/2015 PROCEDURE:   Colonoscopy, surveillance and Colonoscopy with biopsy First Screening Colonoscopy - Avg.  risk and is 50 yrs.  old or older - No.  Prior Negative Screening - Now for repeat screening. N/A  History of Adenoma - Now for follow-up colonoscopy & has been > or = to 3 yrs.  Yes hx of adenoma.  Has been 3 or more years since last colonoscopy.  Polyps removed today? No Recommend repeat exam, <10 yrs? Yes high risk ASA CLASS:   Class II INDICATIONS:Surveillance due to prior colonic neoplasia, Inflammatory bowel disease of the intestine if more precise diagnosis or determination of the extent / severity of activity of disease will influence immediate / future management, PH Colon Adenoma, and Inflammatory Bowel Disease (at least 8 years pancolitis or 15 years left sided colitis). MEDICATIONS: Propofol 500 mg IV and Monitored anesthesia care  DESCRIPTION OF PROCEDURE:   After the risks benefits and alternatives of the procedure were thoroughly explained, informed consent was obtained.  The digital rectal exam revealed no abnormalities of the rectum.   The LB [id number]  endoscope was introduced through the anus and advanced to the cecum, which was identified by both the appendix and ileocecal valve. No adverse events experienced.   The quality of the prep was excellent. (MiraLax was used)  The instrument was then slowly withdrawn as the colon was fully examined. Estimated blood loss is zero unless otherwise noted in this procedure report.  COLON FINDINGS: The colon was redundant.  Manual abdominal counter-pressure was used to reach the cecum.   Mild patchy proctitis and colitis rectum - 30 cm.  Biopsied.   The  examination was otherwise normal.  Left colon surveillance biopises taken. Retroflexion was not performed due to a narrow rectal vault. The time to cecum = 10.8 Withdrawal time = 10.6   The scope was withdrawn and the procedure completed. COMPLICATIONS: There were no immediate complications.  ENDOSCOPIC IMPRESSION: 1.   The colon was redundant 2.   Mild patchy proctitis and colitis rectum - 30 cm.  Biopsied 3.   The examination was otherwise normal - excellent prep - hx Left UC and adenoma (2013)  RECOMMENDATIONS: Timing of repeat colonoscopy will be determined by pathology findings.  She should restart Canasa suppositories.  eSigned:  Gatha Mayer, MD, [facility] 05/24/2015 8:40 AM   cc: Gar Ponto and The Patient

## 2015-05-24 NOTE — Progress Notes (Signed)
Stable to RR 

## 2015-05-24 NOTE — Progress Notes (Signed)
Reported to Charleston Ropes, CRNA that pt's blood pressure was 169/101 on left arm and retook it on right arm 157/106.  Pt said she does not take blood pressure meds and it is not normally high.  Pt has albuteral at bedside with lable applied to it. maw

## 2015-05-25 ENCOUNTER — Telehealth: Payer: Self-pay | Admitting: *Deleted

## 2015-05-25 NOTE — Telephone Encounter (Signed)
  Follow up Call-  Call back number 05/24/2015  Post procedure Call Back phone  # [phone] cell  Permission to leave phone message Yes     Patient questions:  Patient's mailbox is full. Unable to leave message.

## 2015-06-01 ENCOUNTER — Encounter: Payer: Self-pay | Admitting: Internal Medicine

## 2015-06-01 DIAGNOSIS — K5150 Left sided colitis without complications: Secondary | ICD-10-CM

## 2015-06-27 ENCOUNTER — Other Ambulatory Visit: Payer: Self-pay | Admitting: Internal Medicine

## 2015-06-27 HISTORY — PX: CARPAL TUNNEL RELEASE: SHX101

## 2015-07-01 ENCOUNTER — Telehealth: Payer: Self-pay

## 2015-07-01 NOTE — Telephone Encounter (Signed)
Received prior authorization on Delzicol 423m capsules, she takes 6 caps twice a day.  This exceeds the plan's maximum daily dose.  Will route to Dr GCarlean Purlto confirm dose.

## 2015-07-01 NOTE — Telephone Encounter (Signed)
Make the Rx 4 caps tid but she can take yit 6 bid - it is usu a tid med but many take it bid

## 2015-07-02 NOTE — Telephone Encounter (Signed)
After looking at the form that [facility] sent , Dr Carlean Purl has decided that we do the prior authorization for patient's Delzicol.

## 2015-07-02 NOTE — Telephone Encounter (Signed)
Spoke with [name] at [employer] # [phone] and started the prior authorization over the phone for the delzicol 426m caps. Need quantity over ride.  Answered questions, dx:  U.C., left sided  , K51.50.  The case # [id number] being sent for review and we will be notified with approval/denial.

## 2015-07-05 NOTE — Telephone Encounter (Signed)
We received fax stating that the delzicol is approved from 07/02/2015-07/04/2016.  Will send letter to be scanned in.

## 2015-12-14 ENCOUNTER — Other Ambulatory Visit: Payer: Self-pay

## 2015-12-14 MED ORDER — MESALAMINE 400 MG PO CPDR: DELAYED_RELEASE_CAPSULE | ORAL | Status: DC

## 2015-12-14 NOTE — Telephone Encounter (Signed)
Refill for delzicol sent in as requested.

## 2016-06-18 ENCOUNTER — Other Ambulatory Visit: Payer: Self-pay | Admitting: Internal Medicine

## 2017-05-24 ENCOUNTER — Telehealth: Payer: Self-pay | Admitting: Internal Medicine

## 2017-05-24 NOTE — Telephone Encounter (Signed)
Her insurance covers the following medicines: Balsalazide 750 mg capsules Sulfasalazine 548m tablets Sulfasalazine DR 5078mtablets  They told her these would be $25.00 for 3 months worth.  Will route to Dr GeCarlean Purlo advise which one he wants her to take.  She does like to get 3 months worth at a time thru [facility].

## 2017-05-24 NOTE — Telephone Encounter (Signed)
Spoke with [name]  And she will call her insurance company and get names of what is covered and call us back.  She has several weeks of her Delizcol left.

## 2017-05-25 MED ORDER — BALSALAZIDE DISODIUM 750 MG PO CAPS: 2250.0000 mg | ORAL_CAPSULE | Freq: Three times a day (TID) | ORAL | 3 refills | Status: DC

## 2017-05-25 NOTE — Telephone Encounter (Signed)
Balsalazide 2.25 g tid   # 810 -  750 mg capsules w/ 3 RF

## 2017-05-25 NOTE — Telephone Encounter (Signed)
  Spoke with [name] and told her which drug we are going to send in. Sent to [employer].

## 2017-07-11 ENCOUNTER — Encounter: Payer: Self-pay | Admitting: Internal Medicine

## 2017-12-03 ENCOUNTER — Encounter (HOSPITAL_COMMUNITY): Payer: Self-pay | Admitting: Certified Registered"

## 2017-12-03 ENCOUNTER — Encounter (INDEPENDENT_AMBULATORY_CARE_PROVIDER_SITE_OTHER): Payer: BLUE CROSS/BLUE SHIELD | Admitting: Ophthalmology

## 2017-12-03 ENCOUNTER — Ambulatory Visit: Admit: 2017-12-03 | Payer: BLUE CROSS/BLUE SHIELD | Admitting: Ophthalmology

## 2017-12-03 DIAGNOSIS — H35411 Lattice degeneration of retina, right eye: Secondary | ICD-10-CM

## 2017-12-03 DIAGNOSIS — H43813 Vitreous degeneration, bilateral: Secondary | ICD-10-CM

## 2017-12-03 DIAGNOSIS — H2511 Age-related nuclear cataract, right eye: Secondary | ICD-10-CM

## 2017-12-03 DIAGNOSIS — H338 Other retinal detachments: Secondary | ICD-10-CM

## 2017-12-03 DIAGNOSIS — I10 Essential (primary) hypertension: Secondary | ICD-10-CM | POA: Diagnosis not present

## 2017-12-03 DIAGNOSIS — H35033 Hypertensive retinopathy, bilateral: Secondary | ICD-10-CM | POA: Diagnosis not present

## 2017-12-03 SURGERY — SCLERAL BUCKLE
Anesthesia: General | Laterality: Right

## 2017-12-03 NOTE — Progress Notes (Signed)
[facility] Note  12/04/2017     CHIEF COMPLAINT Patient presents for Retina Evaluation   HISTORY OF PRESENT ILLNESS: [name] is a 57 y.o. female who presents to the clinic today for:   HPI    Retina Evaluation    In both eyes.  This started 2 weeks ago.  Associated Symptoms Blind Spot, Flashes and Floaters.  Negative for Glare, Shoulder/Hip pain, Fatigue, Jaw Claudication, Photophobia, Pain, Trauma, Fever, Weight Loss, Scalp Tenderness, Redness and Distortion.  Context:  distance vision, mid-range vision and near vision.  Treatments tried include surgery.  Response to treatment was moderate improvement.  I, the attending physician,  performed the HPI with the patient and updated documentation appropriately.          Comments    Referral of Dr. Zigmund Daniel for retina eval. Patient states appx two weeks ago she had flashes OD,it disappeared and returned with her not being able to see out of lower part of OD. Pt reports she had cataract sx 05/2013 os. Denies eye gtt's, is taking multivitamin.       Last edited by Bernarda Caffey, MD on 12/04/2017  1:00 PM. (History)    Pt states the Saturday of Memorial day weekend (x2 weeks ago) she saw a single flash OD; Pt states she then saw a spider web OD; Pt states those were the only "symptoms"; pt states the following Monday she went to [place] office and was told "everything was okay"; Pt states this past weekend she began to see a shadow coming in from nasal side; Pt states she went back to see [facility] on Monday and was sent to see Dr. Zigmund Daniel; (Mac off Saturday/Sunday -  09/08 - 09/09);   Referring physician: Caryl Bis, MD [place], [place] [address]  HISTORICAL INFORMATION:   Selected notes from the MEDICAL RECORD NUMBER Referred by Dr. Zigmund Daniel for Superior RD OD;  LEE- 06.10.19 (JDM) [BCVA OD: 20/LP OD: 20/20]  Ocular Hx- pseudophakia OS (05/2013), cataract OD, superior RD - break within  lattice, HTN ret OU, vitreous opacities OU PMH- arthritis, sleep apnea, ulcerative colitis     CURRENT MEDICATIONS: Current Outpatient Medications (Ophthalmic Drugs)  Medication Sig  . LOTEMAX 0.5 % ophthalmic suspension INSTILL 1 DROP INTO LEFT EYE 4 TIMES A DAY  . neomycin-polymyxin-dexameth (MAXITROL) 0.1 % OINT Place 1 application into the right eye 4 (four) times daily.  . prednisoLONE acetate (PRED FORTE) 1 % ophthalmic suspension Place 1 drop into the right eye 4 (four) times daily.   No current facility-administered medications for this visit.  (Ophthalmic Drugs)   Current Outpatient Medications (Other)  Medication Sig  . albuterol (PROVENTIL HFA;VENTOLIN HFA) 108 (90 BASE) MCG/ACT inhaler Inhale 2 puffs into the lungs every 6 (six) hours as needed.   . balsalazide (COLAZAL) 750 MG capsule Take 3 capsules (2,250 mg total) by mouth 3 (three) times daily.  . Calcium Carbonate-Vitamin D (CALCIUM 600+D) 600-400 MG-UNIT per tablet Take 1 tablet by mouth 2 (two) times daily.    Marland Kitchen diltiazem 2 % GEL Apply 1 application topically 2 (two) times daily.  . Fluticasone-Salmeterol (ADVAIR) 100-50 MCG/DOSE AEPB Inhale 1 puff into the lungs 2 (two) times daily.    Marland Kitchen loratadine (LORATADINE ALLERGY RELIEF) 10 MG dissolvable tablet Take 10 mg by mouth daily.    . montelukast (SINGULAIR) 10 MG tablet Take 10 mg by mouth at bedtime.    . Multiple Vitamin (MULTIVITAMIN) tablet  Take 1 tablet by mouth daily.    . nebivolol (BYSTOLIC) 2.5 MG tablet Take 2.5 mg by mouth daily.  . Simethicone (GAS-X EXTRA STRENGTH) 125 MG CAPS Take by mouth as needed.    . DELZICOL 400 MG CPDR DR capsule TAKE 6 CAPSULES TWICE A DAY (Patient not taking: Reported on 12/04/2017)  . mesalamine (CANASA) 1000 MG suppository Place 1 suppository (1,000 mg total) rectally as needed. (Patient not taking: Reported on 05/24/2015)  . sertraline (ZOLOFT) 50 MG tablet Take 1.5 tablets by mouth daily.   No current facility-administered  medications for this visit.  (Other)      REVIEW OF SYSTEMS: ROS    Positive for: Eyes   Negative for: Constitutional, Gastrointestinal, Neurological, Skin, Genitourinary, Musculoskeletal, HENT, Endocrine, Cardiovascular, Respiratory, Psychiatric, Allergic/Imm, Heme/Lymph   Last edited by Zenovia Jordan, LPN on 0/93/2355  7:32 AM. (History)       ALLERGIES Allergies  Allergen Reactions  . Cefaclor Rash    PAST MEDICAL HISTORY Past Medical History:  Diagnosis Date  . Allergic rhinitis   . Allergy   . Anemia    iron deficient  . Arthritis   . Asthma    inhaler  . Cataract    left removed, right still there  . Depression   . Eczema   . History of iron deficiency anemia   . Hx of adenomatous polyp of colon 06/05/2012   2013 single adenoma - repeat colonoscopy 2016-17  . Sleep apnea    wears c-pap  . Ulcerative colitis, left sided ([place]) 2003   Past Surgical History:  Procedure Laterality Date  . CARPAL TUNNEL RELEASE  2017   bilateral  . CATARACT EXTRACTION Left 05/2013  . COLONOSCOPY  11/10/2008   ulcerative colitis  . COLONOSCOPY    . EYE SURGERY    . TONSILLECTOMY AND ADENOIDECTOMY  1966    FAMILY HISTORY Family History  Problem Relation Age of Onset  . Colon polyps Father   . Diabetes Father   . Heart disease Mother   . Breast cancer Maternal Grandmother   . Clotting disorder Daughter 24       deceased, blood clot  . Colon cancer Neg Hx   . Esophageal cancer Neg Hx   . Rectal cancer Neg Hx   . Stomach cancer Neg Hx     SOCIAL HISTORY Social History   Tobacco Use  . Smoking status: Never Smoker  . Smokeless tobacco: Never Used  Substance Use Topics  . Alcohol use: Yes    Alcohol/week: 0.5 oz    Types: 1 Standard drinks or equivalent per week    Comment: occ  . Drug use: No         OPHTHALMIC EXAM:  Base Eye Exam    Visual Acuity (Snellen - Linear)      Right Left   Dist [place] 20/400 20/20   Dist ph [place] NI   Pt states VA comes and  goes, but she was able to see the letter at 20/400       Tonometry (Tonopen, 8:04 AM)      Right Left   Pressure 13 17       Pupils      Dark Light Shape React APD   Right 4 2 Round Brisk None   Left 4 2 Round Brisk None       Visual Fields (Counting fingers)      Left Right    Full    Restrictions  Total  superior temporal, inferior temporal, superior nasal, inferior nasal deficiencies       Extraocular Movement      Right Left    Full, Ortho Full, Ortho       Neuro/Psych    Oriented x3:  Yes   Mood/Affect:  Normal       Dilation    Both eyes:  1.0% Mydriacyl, 2.5% Phenylephrine @ 8:04 AM        Slit Lamp and Fundus Exam    Slit Lamp Exam      Right Left   Lids/Lashes Dermatochalasis - upper lid, mild Meibomian gland dysfunction Dermatochalasis - upper lid, mild Telangiectasia   Conjunctiva/Sclera White and quiet White and quiet   Cornea Arcus Arcus, 3+ central Punctate epithelial erosions, decreased TBUT   Anterior Chamber Deep and quiet Deep and quiet   Iris Round and moderately dilated to 44m Round and dilated   Lens 2-3+ Nuclear sclerosis, 2+ Cortical cataract PC IOL in good position, trace Posterior capsular opacification   Vitreous Vitreous syneresis Vitreous syneresis       Fundus Exam      Right Left   Disc Pink and Sharp, partitially obscured by bullous RD Pink and Sharp   C/D Ratio 0.2 0.25   Macula +SRF with bullous RD folded over Flat, Good foveal reflex, mild Retinal pigment epithelial mottling, No heme or edema   Vessels Normal Mild Copper wiring, mildly Tortuous   Periphery Superior retinal detachment from 0900 to 0100 with SRF tracking into macula, 2 retinal tears at 1030 in line with one another in same meridian Attached, lattice degeneration at 1200 almost to ora, lattice at 0500          IMAGING AND PROCEDURES  Imaging and Procedures for _0 @  OCT, Retina - OU - Both Eyes       Right Eye Quality was poor. Findings include  subretinal fluid, abnormal foveal contour.   Left Eye Quality was good. Central Foveal Thickness: 282. Progression has no prior data. Findings include normal foveal contour, no IRF, no SRF.   Notes *Images captured and stored on drive  Diagnosis / Impression:  OD: macula involving superior retinal detachment OS: NFP, No IRF/SRF  Clinical management:  See below  Abbreviations: NFP - Normal foveal profile. CME - cystoid macular edema. PED - pigment epithelial detachment. IRF - intraretinal fluid. SRF - subretinal fluid. EZ - ellipsoid zone. ERM - epiretinal membrane. ORA - outer retinal atrophy. ORT - outer retinal tubulation. SRHM - subretinal hyper-reflective material         Pneumatic Retinopexy - OD - Right Eye       PROCEDURE NOTE  Diagnosis:  Retinal detachment with retinal tears, RIGHT EYE  Procedure:  Pneumatic cryopexy with C3F8 gas injection, RIGHT EYE  CPT:  621308 Surgeon: BBernarda Caffey M.D.,Ph.D.  Anesthesia:  Subconjunctival Lidocaine   The patient was brought to the procedure room. Informed consent had already been obtained for cryopexy of tears and intravitreal gas injection. The diagnosis was reviewed with the patient and all questions were answered. The risks of the procedure including potential systemic risks like stroke and heart attack and other thrombotic events as well as the local risks like infection, endophthalmitis, retinal detachment were discussed. The patient consented to the procedure.   The RIGHT eye was marked and a time out was performed identifying the correct eye. Subsequently, the patient was placed in the supine position. Topical anesthetic drops were given and a lid speculum was  placed to expose the eye. Subsequently, 2% Lidocaine was injected subconjunctivally in the quadrants of the tears giving adequate anesthesia. Using indirect ophthalmoscopy the cryo probe was positioned beneath the tears at 1030 and choroidal and retinal whitening was  achieved 360 degrees around the tear margins. The superotemporal quadrant was then cleaned with Betadine swabs and allowed to dry. At this time, 4 mm posterior to the limbus utilizing a 30-gauge needle, 0.45 cc of pure, 100% C3F8 gas was injected into the vitreous cavity under direct visualization. The needle was then withdrawn from the eye and the retina and gas bubble were visualized. An AC tap was performed to normalize the pressure and the central retinal artery was noted to be patent. Betadine was then reapplied to the injection sites and then rinsed with sterile BSS. Tobradex opthalmic ung was applied to the eye, then the eye was patched with eye pads and paper tape. An arrow was drawn on the dressing to assist with post-operative positioning. There were no complications. Discharge and post-operative instructions were reviewed.                 ASSESSMENT/PLAN:    ICD-10-CM   1. 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 Essential hypertension I10   4. Hypertensive retinopathy of both eyes H35.033   5. Retinal edema H35.81 OCT, Retina - OU - Both Eyes  6. 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 Pseudophakia of left eye Z96.1     1. Macula-involving rhegmatogenous retinal detachment, OD- -The incidence, risk factors, and natural history of retinal detachment was discussed with patient.  Potential treatment options including delimiting laser, pneumatic retinopexy, scleral buckle, and vitrectomy, cryotherapy and laser, and the use of air, gas, and oil discussed with patient.  The risks of blindness, loss of vision, infection, hemorrhage, cataract progression or lens displacement were discussed with patient. - RD from 0900 to 0100 with SRF tracking into macula - 2 tears at 1030 meridian  - pt wishes to undergo pneumatic cryopexy OD today  - RBA of  procedure discussed, questions answered - informed consent obtained and signed - see procedure note -- mild hyphema developed during [employer] tap - post op positioning instructions reviewed - start maxitrol ung and PF gtts QID OD - F/U 1 day  2. Lattice degeneration w/ atrophic holes, OU - lattice at 12 and 5 oclock - discussed findings, prognosis, and treatment options including observation - recommend laser retinopexy OS once OD more stable  3, 4. Hypertensive retinopathy OU - discussed importance of tight BP control - monitor  5. Combined form age-related cataract OD-  - The symptoms of cataract, surgical options, and treatments and risks were discussed with patient. - discussed diagnosis and progression - not yet visually significant - monitor for now  6. Pseudophakia OS  - s/p CE/IOL OS [facility])  - beautiful surgery, doing well  - monitor    Ophthalmic Meds Ordered this visit:  Meds ordered this encounter  Medications  . neomycin-polymyxin-dexameth (MAXITROL) 0.1 % OINT    Sig: Place 1 application into the right eye 4 (four) times daily.    Dispense:  3.5 g    Refill:  2  . prednisoLONE acetate (PRED FORTE) 1 % ophthalmic suspension    Sig: Place 1 drop into the right eye 4 (four) times  daily.    Dispense:  10 mL    Refill:  0       Return in about 1 day (around 12/05/2017) for POV.  There are no Patient Instructions on file for this visit.   Explained the diagnoses, plan, and follow up with the patient and they expressed understanding.  Patient expressed understanding of the importance of proper follow up care.   This document serves as a record of services personally performed by Gardiner Sleeper, MD, PhD. It was created on their behalf by Catha Brow, Marie, a certified ophthalmic assistant. The creation of this record is the provider's dictation and/or activities during the visit.  Electronically signed by: Catha Brow, [place]  06.10.19 1:06  PM    Gardiner Sleeper, M.D., Ph.D. Diseases & Surgery of the Retina and Vitreous [facility]   I have reviewed the above documentation for accuracy and completeness, and I agree with the above. Gardiner Sleeper, M.D., Ph.D. 12/04/17 1:06 PM     Abbreviations: M myopia (nearsighted); A astigmatism; H hyperopia (farsighted); P presbyopia; Mrx spectacle prescription;  CTL contact lenses; OD right eye; OS left eye; OU both eyes  XT exotropia; ET esotropia; PEK punctate epithelial keratitis; PEE punctate epithelial erosions; DES dry eye syndrome; MGD meibomian gland dysfunction; ATs artificial tears; PFAT's preservative free artificial tears; [place] nuclear sclerotic cataract; PSC posterior subcapsular cataract; ERM epi-retinal membrane; PVD posterior vitreous detachment; RD retinal detachment; DM diabetes mellitus; DR diabetic retinopathy; NPDR non-proliferative diabetic retinopathy; PDR proliferative diabetic retinopathy; CSME clinically significant macular edema; DME diabetic macular edema; dbh dot blot hemorrhages; CWS cotton wool spot; POAG primary open angle glaucoma; C/D cup-to-disc ratio; HVF humphrey visual field; GVF goldmann visual field; OCT optical coherence tomography; IOP intraocular pressure; BRVO Branch retinal vein occlusion; CRVO central retinal vein occlusion; CRAO central retinal artery occlusion; BRAO branch retinal artery occlusion; RT retinal tear; SB scleral buckle; PPV pars plana vitrectomy; VH Vitreous hemorrhage; PRP panretinal laser photocoagulation; IVK intravitreal kenalog; VMT vitreomacular traction; MH Macular hole;  NVD neovascularization of the disc; NVE neovascularization elsewhere; AREDS age related eye disease study; ARMD age related macular degeneration; POAG primary open angle glaucoma; EBMD epithelial/anterior basement membrane dystrophy; ACIOL anterior chamber intraocular lens; IOL intraocular lens; PCIOL posterior chamber intraocular lens;  Phaco/IOL phacoemulsification with intraocular lens placement; [place] photorefractive keratectomy; LASIK laser assisted in situ keratomileusis; HTN hypertension; DM diabetes mellitus; COPD chronic obstructive pulmonary disease

## 2017-12-04 ENCOUNTER — Ambulatory Visit (INDEPENDENT_AMBULATORY_CARE_PROVIDER_SITE_OTHER): Payer: BLUE CROSS/BLUE SHIELD | Admitting: Ophthalmology

## 2017-12-04 ENCOUNTER — Encounter (INDEPENDENT_AMBULATORY_CARE_PROVIDER_SITE_OTHER): Payer: Self-pay | Admitting: Ophthalmology

## 2017-12-04 DIAGNOSIS — Z961 Presence of intraocular lens: Secondary | ICD-10-CM

## 2017-12-04 DIAGNOSIS — H25811 Combined forms of age-related cataract, right eye: Secondary | ICD-10-CM

## 2017-12-04 DIAGNOSIS — H3321 Serous retinal detachment, right eye: Secondary | ICD-10-CM

## 2017-12-04 DIAGNOSIS — H35033 Hypertensive retinopathy, bilateral: Secondary | ICD-10-CM | POA: Diagnosis not present

## 2017-12-04 DIAGNOSIS — H3581 Retinal edema: Secondary | ICD-10-CM

## 2017-12-04 DIAGNOSIS — I10 Essential (primary) hypertension: Secondary | ICD-10-CM

## 2017-12-04 DIAGNOSIS — H35413 Lattice degeneration of retina, bilateral: Secondary | ICD-10-CM | POA: Diagnosis not present

## 2017-12-04 HISTORY — PX: OTHER SURGICAL HISTORY: SHX169

## 2017-12-04 MED ORDER — PREDNISOLONE ACETATE 1 % OP SUSP: 1.0000 [drp] | Freq: Four times a day (QID) | OPHTHALMIC | 0 refills | Status: DC

## 2017-12-04 MED ORDER — NEOMYCIN-POLYMYXIN-DEXAMETH 0.1 % OP OINT: 1.0000 "application " | TOPICAL_OINTMENT | Freq: Four times a day (QID) | OPHTHALMIC | 2 refills | Status: DC

## 2017-12-05 ENCOUNTER — Encounter (INDEPENDENT_AMBULATORY_CARE_PROVIDER_SITE_OTHER): Payer: Self-pay | Admitting: Ophthalmology

## 2017-12-05 ENCOUNTER — Ambulatory Visit (INDEPENDENT_AMBULATORY_CARE_PROVIDER_SITE_OTHER): Payer: BLUE CROSS/BLUE SHIELD | Admitting: Ophthalmology

## 2017-12-05 DIAGNOSIS — H3581 Retinal edema: Secondary | ICD-10-CM

## 2017-12-05 DIAGNOSIS — H25811 Combined forms of age-related cataract, right eye: Secondary | ICD-10-CM

## 2017-12-05 DIAGNOSIS — Z961 Presence of intraocular lens: Secondary | ICD-10-CM

## 2017-12-05 DIAGNOSIS — I10 Essential (primary) hypertension: Secondary | ICD-10-CM

## 2017-12-05 DIAGNOSIS — H35413 Lattice degeneration of retina, bilateral: Secondary | ICD-10-CM

## 2017-12-05 DIAGNOSIS — H3321 Serous retinal detachment, right eye: Secondary | ICD-10-CM

## 2017-12-05 DIAGNOSIS — H35033 Hypertensive retinopathy, bilateral: Secondary | ICD-10-CM

## 2017-12-05 NOTE — Progress Notes (Addendum)
[facility] Note  12/05/2017     CHIEF COMPLAINT Patient presents for Post-op Follow-up s/p pneumatic cryopexy OD 12/04/17  HISTORY OF PRESENT ILLNESS: [name] is a 57 y.o. female who presents to the clinic today for:   HPI    Post-op Follow-up    In right eye.  Discomfort includes tearing.  Negative for pain, itching, foreign body sensation, discharge, floaters and none.  Vision is stable.  I, the attending physician,  performed the HPI with the patient and updated documentation appropriately.          Comments    POV 1; S/P pneumatic cryopexy OD (06.12.19); Pt states she rested well yesterday; Pt states she feels she has been able to keep head position; Pt states she is swollen; Pt reports she is able to see "bubbles"; Pt states she feels she is able to see "a little more"; Pt states she is using ung as directed;        Last edited by Bernarda Caffey, MD on 12/05/2017  8:18 AM. (History)      Referring physician: Caryl Bis, MD [place],  [id number]  HISTORICAL INFORMATION:   Selected notes from the MEDICAL RECORD NUMBER Referred by Dr. Zigmund Daniel for Superior RD OD;  LEE- 06.10.19 (JDM) [BCVA OD: 20/LP OD: 20/20]  Ocular Hx- pseudophakia OS (05/2013), cataract OD, superior RD - break within lattice, HTN ret OU, vitreous opacities OU PMH- arthritis, sleep apnea, ulcerative colitis     CURRENT MEDICATIONS: Current Outpatient Medications (Ophthalmic Drugs)  Medication Sig  . LOTEMAX 0.5 % ophthalmic suspension INSTILL 1 DROP INTO LEFT EYE 4 TIMES A DAY  . neomycin-polymyxin-dexameth (MAXITROL) 0.1 % OINT Place 1 application into the right eye 4 (four) times daily.  . prednisoLONE acetate (PRED FORTE) 1 % ophthalmic suspension Place 1 drop into the right eye 4 (four) times daily.   No current facility-administered medications for this visit.  (Ophthalmic Drugs)   Current Outpatient Medications (Other)  Medication Sig  .  albuterol (PROVENTIL HFA;VENTOLIN HFA) 108 (90 BASE) MCG/ACT inhaler Inhale 2 puffs into the lungs every 6 (six) hours as needed.   . balsalazide (COLAZAL) 750 MG capsule Take 3 capsules (2,250 mg total) by mouth 3 (three) times daily.  . Calcium Carbonate-Vitamin D (CALCIUM 600+D) 600-400 MG-UNIT per tablet Take 1 tablet by mouth 2 (two) times daily.    . DELZICOL 400 MG CPDR DR capsule TAKE 6 CAPSULES TWICE A DAY (Patient not taking: Reported on 12/04/2017)  . diltiazem 2 % GEL Apply 1 application topically 2 (two) times daily.  . Fluticasone-Salmeterol (ADVAIR) 100-50 MCG/DOSE AEPB Inhale 1 puff into the lungs 2 (two) times daily.    Marland Kitchen loratadine (LORATADINE ALLERGY RELIEF) 10 MG dissolvable tablet Take 10 mg by mouth daily.    . mesalamine (CANASA) 1000 MG suppository Place 1 suppository (1,000 mg total) rectally as needed. (Patient not taking: Reported on 05/24/2015)  . montelukast (SINGULAIR) 10 MG tablet Take 10 mg by mouth at bedtime.    . Multiple Vitamin (MULTIVITAMIN) tablet Take 1 tablet by mouth daily.    . nebivolol (BYSTOLIC) 2.5 MG tablet Take 2.5 mg by mouth daily.  . sertraline (ZOLOFT) 50 MG tablet Take 1.5 tablets by mouth daily.  . Simethicone (GAS-X EXTRA STRENGTH) 125 MG CAPS Take by mouth as needed.     No current facility-administered medications for this visit.  (Other)      REVIEW OF  SYSTEMS: ROS    Positive for: Eyes, Respiratory   Negative for: Constitutional, Gastrointestinal, Neurological, Skin, Genitourinary, Musculoskeletal, HENT, Endocrine, Cardiovascular, Psychiatric, Allergic/Imm, Heme/Lymph   Last edited by Cherrie Gauze, COA on 12/05/2017  8:09 AM. (History)       ALLERGIES Allergies  Allergen Reactions  . Cefaclor Rash    PAST MEDICAL HISTORY Past Medical History:  Diagnosis Date  . Allergic rhinitis   . Allergy   . Anemia    iron deficient  . Arthritis   . Asthma    inhaler  . Cataract    left removed, right still there  .  Depression   . Eczema   . History of iron deficiency anemia   . Hx of adenomatous polyp of colon 06/05/2012   2013 single adenoma - repeat colonoscopy 2016-17  . Sleep apnea    wears c-pap  . Ulcerative colitis, left sided ([place]) 2003   Past Surgical History:  Procedure Laterality Date  . CARPAL TUNNEL RELEASE  2017   bilateral  . CATARACT EXTRACTION Left 05/2013  . COLONOSCOPY  11/10/2008   ulcerative colitis  . COLONOSCOPY    . EYE SURGERY    . TONSILLECTOMY AND ADENOIDECTOMY  1966    FAMILY HISTORY Family History  Problem Relation Age of Onset  . Colon polyps Father   . Diabetes Father   . Heart disease Mother   . Breast cancer Maternal Grandmother   . Clotting disorder Daughter 24       deceased, blood clot  . Colon cancer Neg Hx   . Esophageal cancer Neg Hx   . Rectal cancer Neg Hx   . Stomach cancer Neg Hx     SOCIAL HISTORY Social History   Tobacco Use  . Smoking status: Never Smoker  . Smokeless tobacco: Never Used  Substance Use Topics  . Alcohol use: Yes    Alcohol/week: 0.5 oz    Types: 1 Standard drinks or equivalent per week    Comment: occ  . Drug use: No         OPHTHALMIC EXAM:  Base Eye Exam    Visual Acuity (Snellen - Linear)      Right Left   Dist [place] CF at face 20/20   Dist ph [place] NI        Tonometry (Tonopen, 8:17 AM)      Right Left   Pressure 38 20       Pupils      Dark Light Shape React APD   Right 3  Round NR None   Left 5 3 Round Brisk None       Neuro/Psych    Oriented x3:  Yes   Mood/Affect:  Normal       Dilation    Right eye:  1.0% Mydriacyl, 2.5% Phenylephrine @ 8:17 AM        Slit Lamp and Fundus Exam    External Exam      Right Left   External Periorbital edema        Slit Lamp Exam      Right Left   Lids/Lashes Dermatochalasis - upper lid, mild Meibomian gland dysfunction Dermatochalasis - upper lid, mild Telangiectasia   Conjunctiva/Sclera [place], 360 Chemosis White and quiet   Cornea Arcus,  micro cystic edema Arcus, 3+ central Punctate epithelial erosions, decreased TBUT   Anterior Chamber Deep; clear Deep and quiet   Iris Round and moderately dilated to 51m Round and dilated   Lens 2-3+  Nuclear sclerosis, 2+ Cortical cataract PC IOL in good position, trace Posterior capsular opacification   Vitreous Vitreous syneresis; gas bubbles superiorly, ~30% gas bubble; tr VH Vitreous syneresis       Fundus Exam      Right Left   Disc Hazy view; no details    C/D Ratio 0.2 0.25   Macula hazy view, but improved SRF with bullous RD not folded over    Vessels Normal    Periphery hazy view; Superior retinal detachment from 0900 to 0100 with SRF tracking into macula -- SRF improved, 2 retinal tears at 1030 in line with one another in same meridian    Poor view in right eye          IMAGING AND PROCEDURES  Imaging and Procedures for @TODAY @           ASSESSMENT/PLAN:    ICD-10-CM   1. 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 Essential hypertension I10   4. Hypertensive retinopathy of both eyes H35.033   5. Retinal edema H35.81   6. 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 Pseudophakia of left eye Z96.1     1. Macula-involving rhegmatogenous retinal detachment, OD- - RD from 0900 to 0100 with SRF tracking into macula - 2 tears at 1030 meridian  - s/p pneumatic cryopexy OD, 6.11.19  - IOP elevated to 38 today with corneal haze - cont maxitrol ung and PF gtts QID OD - start cosopt OD BID, brimonidine OD BID, and atropine OD BID - F/U 1 day  2. Lattice degeneration w/ atrophic holes, OU - lattice at 12 and 5 oclock - discussed findings, prognosis, and treatment options including observation - recommend laser retinopexy OS once OD more stable  3, 4. Hypertensive retinopathy OU - discussed importance of tight BP control - monitor  5. Combined form age-related cataract OD-  - The symptoms of  cataract, surgical options, and treatments and risks were discussed with patient. - discussed diagnosis and progression - not yet visually significant - monitor for now  6. Pseudophakia OS  - s/p CE/IOL OS [facility])  - beautiful surgery, doing well  - monitor    Ophthalmic Meds Ordered this visit:  No orders of the defined types were placed in this encounter.      Return in about 1 day (around 12/06/2017) for POV.  There are no Patient Instructions on file for this visit.   Explained the diagnoses, plan, and follow up with the patient and they expressed understanding.  Patient expressed understanding of the importance of proper follow up care.   This document serves as a record of services personally performed by Gardiner Sleeper, MD, PhD. It was created on their behalf by Catha Brow, [place], a certified ophthalmic assistant. The creation of this record is the provider's dictation and/or activities during the visit.  Electronically signed by: Catha Brow, COA  06.12.19 10:01 PM    Gardiner Sleeper, M.D., Ph.D. Diseases & Surgery of the Retina and Vitreous [facility]  I have reviewed the above documentation for accuracy and completeness, and I agree with the above. Gardiner Sleeper, M.D., Ph.D. 12/05/17 10:15 PM    Abbreviations: M myopia (nearsighted); A astigmatism; H hyperopia (farsighted); P presbyopia; Mrx spectacle prescription;  CTL contact lenses; OD right eye; OS left eye; OU both eyes  XT exotropia; ET esotropia; PEK punctate epithelial keratitis;  PEE punctate epithelial erosions; DES dry eye syndrome; MGD meibomian gland dysfunction; ATs artificial tears; PFAT's preservative free artificial tears; [place] nuclear sclerotic cataract; PSC posterior subcapsular cataract; ERM epi-retinal membrane; PVD posterior vitreous detachment; RD retinal detachment; DM diabetes mellitus; DR diabetic retinopathy; NPDR non-proliferative diabetic  retinopathy; PDR proliferative diabetic retinopathy; CSME clinically significant macular edema; DME diabetic macular edema; dbh dot blot hemorrhages; CWS cotton wool spot; POAG primary open angle glaucoma; C/D cup-to-disc ratio; HVF humphrey visual field; GVF goldmann visual field; OCT optical coherence tomography; IOP intraocular pressure; BRVO Branch retinal vein occlusion; CRVO central retinal vein occlusion; CRAO central retinal artery occlusion; BRAO branch retinal artery occlusion; RT retinal tear; SB scleral buckle; PPV pars plana vitrectomy; VH Vitreous hemorrhage; PRP panretinal laser photocoagulation; IVK intravitreal kenalog; VMT vitreomacular traction; MH Macular hole;  NVD neovascularization of the disc; NVE neovascularization elsewhere; AREDS age related eye disease study; ARMD age related macular degeneration; POAG primary open angle glaucoma; EBMD epithelial/anterior basement membrane dystrophy; ACIOL anterior chamber intraocular lens; IOL intraocular lens; PCIOL posterior chamber intraocular lens; Phaco/IOL phacoemulsification with intraocular lens placement; [place] photorefractive keratectomy; LASIK laser assisted in situ keratomileusis; HTN hypertension; DM diabetes mellitus; COPD chronic obstructive pulmonary disease

## 2017-12-06 ENCOUNTER — Ambulatory Visit (INDEPENDENT_AMBULATORY_CARE_PROVIDER_SITE_OTHER): Payer: BLUE CROSS/BLUE SHIELD | Admitting: Ophthalmology

## 2017-12-06 ENCOUNTER — Encounter (INDEPENDENT_AMBULATORY_CARE_PROVIDER_SITE_OTHER): Payer: Self-pay | Admitting: Ophthalmology

## 2017-12-06 DIAGNOSIS — Z961 Presence of intraocular lens: Secondary | ICD-10-CM

## 2017-12-06 DIAGNOSIS — H35033 Hypertensive retinopathy, bilateral: Secondary | ICD-10-CM

## 2017-12-06 DIAGNOSIS — H25811 Combined forms of age-related cataract, right eye: Secondary | ICD-10-CM

## 2017-12-06 DIAGNOSIS — H3581 Retinal edema: Secondary | ICD-10-CM

## 2017-12-06 DIAGNOSIS — I10 Essential (primary) hypertension: Secondary | ICD-10-CM

## 2017-12-06 DIAGNOSIS — H35413 Lattice degeneration of retina, bilateral: Secondary | ICD-10-CM

## 2017-12-06 DIAGNOSIS — H3321 Serous retinal detachment, right eye: Secondary | ICD-10-CM

## 2017-12-06 NOTE — Progress Notes (Signed)
[facility] Note  12/06/2017     CHIEF COMPLAINT Patient presents for Post-op Follow-up s/p pneumatic cryopexy OD 12/04/17  HISTORY OF PRESENT ILLNESS: [name] is a 57 y.o. female who presents to the clinic today for:   HPI    Post-op Follow-up    In right eye.  I, the attending physician,  performed the HPI with the patient and updated documentation appropriately.          Comments    POV S/P Pneumatic cryopexy OD 12/05/17. Patient states the bubbles have gone to one bubble, "I can see thru the bubble" ,vision is still blurry ,but vision is gradually improving.Patient is compliant with eye gtt's instructed.       Last edited by Bernarda Caffey, MD on 12/06/2017 10:26 AM. (History)      Referring physician: Caryl Bis, MD [place], [place] [address]  HISTORICAL INFORMATION:   Selected notes from the MEDICAL RECORD NUMBER Referred by Dr. Zigmund Daniel for Superior RD OD;  LEE- 06.10.19 (JDM) [BCVA OD: 20/LP OD: 20/20]  Ocular Hx- pseudophakia OS (05/2013), cataract OD, superior RD - break within lattice, HTN ret OU, vitreous opacities OU PMH- arthritis, sleep apnea, ulcerative colitis     CURRENT MEDICATIONS: Current Outpatient Medications (Ophthalmic Drugs)  Medication Sig  . LOTEMAX 0.5 % ophthalmic suspension INSTILL 1 DROP INTO LEFT EYE 4 TIMES A DAY  . neomycin-polymyxin-dexameth (MAXITROL) 0.1 % OINT Place 1 application into the right eye 4 (four) times daily.  . prednisoLONE acetate (PRED FORTE) 1 % ophthalmic suspension Place 1 drop into the right eye 4 (four) times daily.   No current facility-administered medications for this visit.  (Ophthalmic Drugs)   Current Outpatient Medications (Other)  Medication Sig  . albuterol (PROVENTIL HFA;VENTOLIN HFA) 108 (90 BASE) MCG/ACT inhaler Inhale 2 puffs into the lungs every 6 (six) hours as needed.   . balsalazide (COLAZAL) 750 MG capsule Take 3 capsules (2,250 mg total) by mouth 3  (three) times daily.  . Calcium Carbonate-Vitamin D (CALCIUM 600+D) 600-400 MG-UNIT per tablet Take 1 tablet by mouth 2 (two) times daily.    . DELZICOL 400 MG CPDR DR capsule TAKE 6 CAPSULES TWICE A DAY  . diltiazem 2 % GEL Apply 1 application topically 2 (two) times daily.  . Fluticasone-Salmeterol (ADVAIR) 100-50 MCG/DOSE AEPB Inhale 1 puff into the lungs 2 (two) times daily.    Marland Kitchen loratadine (LORATADINE ALLERGY RELIEF) 10 MG dissolvable tablet Take 10 mg by mouth daily.    . mesalamine (CANASA) 1000 MG suppository Place 1 suppository (1,000 mg total) rectally as needed.  . montelukast (SINGULAIR) 10 MG tablet Take 10 mg by mouth at bedtime.    . Multiple Vitamin (MULTIVITAMIN) tablet Take 1 tablet by mouth daily.    . nebivolol (BYSTOLIC) 2.5 MG tablet Take 2.5 mg by mouth daily.  . sertraline (ZOLOFT) 50 MG tablet Take 1.5 tablets by mouth daily.  . Simethicone (GAS-X EXTRA STRENGTH) 125 MG CAPS Take by mouth as needed.     No current facility-administered medications for this visit.  (Other)      REVIEW OF SYSTEMS: ROS    Positive for: Eyes   Negative for: Constitutional, Gastrointestinal, Neurological, Skin, Genitourinary, Musculoskeletal, HENT, Endocrine, Cardiovascular, Respiratory, Psychiatric, Allergic/Imm, Heme/Lymph   Last edited by Zenovia Jordan, LPN on 9/48/5462 70:35 AM. (History)       ALLERGIES Allergies  Allergen Reactions  . Cefaclor Rash  PAST MEDICAL HISTORY Past Medical History:  Diagnosis Date  . Allergic rhinitis   . Allergy   . Anemia    iron deficient  . Arthritis   . Asthma    inhaler  . Cataract    left removed, right still there  . Depression   . Eczema   . History of iron deficiency anemia   . Hx of adenomatous polyp of colon 06/05/2012   2013 single adenoma - repeat colonoscopy 2016-17  . Sleep apnea    wears c-pap  . Ulcerative colitis, left sided ([place]) 2003   Past Surgical History:  Procedure Laterality Date  . CARPAL  TUNNEL RELEASE  2017   bilateral  . CATARACT EXTRACTION Left 05/2013  . COLONOSCOPY  11/10/2008   ulcerative colitis  . COLONOSCOPY    . EYE SURGERY    . TONSILLECTOMY AND ADENOIDECTOMY  1966    FAMILY HISTORY Family History  Problem Relation Age of Onset  . Colon polyps Father   . Diabetes Father   . Heart disease Mother   . Breast cancer Maternal Grandmother   . Clotting disorder Daughter 24       deceased, blood clot  . Colon cancer Neg Hx   . Esophageal cancer Neg Hx   . Rectal cancer Neg Hx   . Stomach cancer Neg Hx     SOCIAL HISTORY Social History   Tobacco Use  . Smoking status: Never Smoker  . Smokeless tobacco: Never Used  Substance Use Topics  . Alcohol use: Yes    Alcohol/week: 0.5 oz    Types: 1 Standard drinks or equivalent per week    Comment: occ  . Drug use: No         OPHTHALMIC EXAM:  Base Eye Exam    Visual Acuity (Snellen - Linear)      Right Left   Dist [place] 20/150 -1 20/20 -1   Dist ph [place] 20/150 NI       Tonometry (Tonopen, 10:01 AM)      Right Left   Pressure 22 19       Pupils      Dark Light Shape React APD   Right 4 4 Round NR None   Left 4 2 Round Brisk None       Extraocular Movement      Right Left    Full Full       Neuro/Psych    Oriented x3:  Yes   Mood/Affect:  Normal       Dilation    Right eye:  1.0% Mydriacyl, 2.5% Phenylephrine @ 10:01 AM        Slit Lamp and Fundus Exam    External Exam      Right Left   External Periorbital edema        Slit Lamp Exam      Right Left   Lids/Lashes Dermatochalasis - upper lid, mild Meibomian gland dysfunction Dermatochalasis - upper lid, mild Telangiectasia   Conjunctiva/Sclera SCH, ST pneumatic Chemosis White and quiet   Cornea Arcus, , 1+ Punctate epithelial erosions Arcus, 3+ central Punctate epithelial erosions, decreased TBUT   Anterior Chamber Deep; clear Deep and quiet   Iris Round and moderately dilated to 36m Round and dilated   Lens 2-3+ Nuclear  sclerosis, 2+ Cortical cataract - greatest nasally PC IOL in good position, trace Posterior capsular opacification   Vitreous Vitreous syneresis; single gas bubble superiorly, ~35% gas bubble; tr VH Vitreous syneresis  Fundus Exam      Right Left   Disc Hazy view -- improved, Pink and Sharp    C/D Ratio 0.3 - well perfused 0.25   Macula hazy view, grossly flat, SRF improved    Vessels Normal    Periphery hazy view; Superior retinal detachment from 0900 to 0100 with SRF tracking into macula -- SRF improved, 2 retinal tears at 1030 in line with one another in same meridian, early cryo changes visible in ST quadrant           IMAGING AND PROCEDURES  Imaging and Procedures for @TODAY @           ASSESSMENT/PLAN:    ICD-10-CM   1. 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 Essential hypertension I10   4. Hypertensive retinopathy of both eyes H35.033   5. Retinal edema H35.81   6. 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 Pseudophakia of left eye Z96.1     1. Macula-involving rhegmatogenous retinal detachment, OD- - RD from 0900 to 0100 with SRF tracking into macula - 2 tears at 1030 meridian  - s/p pneumatic cryopexy OD, 6.11.19  - IOP improved to 22 from 38 today  - on cosopt, brimonidine  - SRF improving, but view is hazy - cont maxitrol ung and PF gtts QID OD - cont cosopt OD BID, brimonidine OD BID, and atropine OD BID - F/U 1 day, DFE, OCT  2. Lattice degeneration w/ atrophic holes, OU - lattice at 12 and 5 oclock - discussed findings, prognosis, and treatment options including observation - recommend laser retinopexy OS once OS more stable  3, 4. Hypertensive retinopathy OU - discussed importance of tight BP control - monitor  5. Combined form age-related cataract OD-  - The symptoms of cataract, surgical options, and treatments and risks were discussed with patient. - discussed  diagnosis and progression - not yet visually significant - monitor for now  6. Pseudophakia OS  - s/p CE/IOL OS [facility])  - beautiful surgery, doing well  - monitor    Ophthalmic Meds Ordered this visit:  No orders of the defined types were placed in this encounter.      Return in about 1 day (around 12/07/2017) for POV.  There are no Patient Instructions on file for this visit.   Explained the diagnoses, plan, and follow up with the patient and they expressed understanding.  Patient expressed understanding of the importance of proper follow up care.   This document serves as a record of services personally performed by Gardiner Sleeper, MD, PhD. It was created on their behalf by Catha Brow, [place], a certified ophthalmic assistant. The creation of this record is the provider's dictation and/or activities during the visit.  Electronically signed by: Catha Brow, COA  06.13.19 3:12 PM   Gardiner Sleeper, M.D., Ph.D. Diseases & Surgery of the Retina and Vitreous [facility]  I have reviewed the above documentation for accuracy and completeness, and I agree with the above. Gardiner Sleeper, M.D., Ph.D. 12/06/17 3:13 PM    Abbreviations: M myopia (nearsighted); A astigmatism; H hyperopia (farsighted); P presbyopia; Mrx spectacle prescription;  CTL contact lenses; OD right eye; OS left eye; OU both eyes  XT exotropia; ET esotropia; PEK punctate epithelial keratitis; PEE punctate epithelial erosions; DES dry eye syndrome; MGD meibomian gland dysfunction; ATs artificial tears; PFAT's preservative free artificial tears; [place]  nuclear sclerotic cataract; PSC posterior subcapsular cataract; ERM epi-retinal membrane; PVD posterior vitreous detachment; RD retinal detachment; DM diabetes mellitus; DR diabetic retinopathy; NPDR non-proliferative diabetic retinopathy; PDR proliferative diabetic retinopathy; CSME clinically significant macular edema; DME diabetic  macular edema; dbh dot blot hemorrhages; CWS cotton wool spot; POAG primary open angle glaucoma; C/D cup-to-disc ratio; HVF humphrey visual field; GVF goldmann visual field; OCT optical coherence tomography; IOP intraocular pressure; BRVO Branch retinal vein occlusion; CRVO central retinal vein occlusion; CRAO central retinal artery occlusion; BRAO branch retinal artery occlusion; RT retinal tear; SB scleral buckle; PPV pars plana vitrectomy; VH Vitreous hemorrhage; PRP panretinal laser photocoagulation; IVK intravitreal kenalog; VMT vitreomacular traction; MH Macular hole;  NVD neovascularization of the disc; NVE neovascularization elsewhere; AREDS age related eye disease study; ARMD age related macular degeneration; POAG primary open angle glaucoma; EBMD epithelial/anterior basement membrane dystrophy; ACIOL anterior chamber intraocular lens; IOL intraocular lens; PCIOL posterior chamber intraocular lens; Phaco/IOL phacoemulsification with intraocular lens placement; [place] photorefractive keratectomy; LASIK laser assisted in situ keratomileusis; HTN hypertension; DM diabetes mellitus; COPD chronic obstructive pulmonary disease

## 2017-12-07 ENCOUNTER — Ambulatory Visit (INDEPENDENT_AMBULATORY_CARE_PROVIDER_SITE_OTHER): Payer: BLUE CROSS/BLUE SHIELD | Admitting: Ophthalmology

## 2017-12-07 ENCOUNTER — Encounter (INDEPENDENT_AMBULATORY_CARE_PROVIDER_SITE_OTHER): Payer: Self-pay | Admitting: Ophthalmology

## 2017-12-07 DIAGNOSIS — H3581 Retinal edema: Secondary | ICD-10-CM | POA: Diagnosis not present

## 2017-12-07 DIAGNOSIS — H35413 Lattice degeneration of retina, bilateral: Secondary | ICD-10-CM

## 2017-12-07 DIAGNOSIS — H3321 Serous retinal detachment, right eye: Secondary | ICD-10-CM

## 2017-12-07 DIAGNOSIS — Z961 Presence of intraocular lens: Secondary | ICD-10-CM

## 2017-12-07 DIAGNOSIS — I10 Essential (primary) hypertension: Secondary | ICD-10-CM

## 2017-12-07 DIAGNOSIS — H35033 Hypertensive retinopathy, bilateral: Secondary | ICD-10-CM

## 2017-12-07 DIAGNOSIS — H25811 Combined forms of age-related cataract, right eye: Secondary | ICD-10-CM

## 2017-12-07 NOTE — Progress Notes (Signed)
[facility] Note  12/07/2017     CHIEF COMPLAINT Patient presents for Post-op Follow-up s/p pneumatic cryopexy OD 12/04/17  HISTORY OF PRESENT ILLNESS: [name] is a 57 y.o. female who presents to the clinic today for:   HPI    Post-op Follow-up    In right eye.  Discomfort includes floaters.  Negative for pain, itching, foreign body sensation, tearing and discharge.  Vision is stable.  I, the attending physician,  performed the HPI with the patient and updated documentation appropriately.          Comments    Pt presents for RD OD F/U, pt states she feels like her VA is improving, she is able to see the gas bubble, she is seeing new floaters, but denies pain, flashes of light or wavy vision       Last edited by Bernarda Caffey, MD on 12/07/2017  8:30 AM. (History)      Referring physician: Caryl Bis, MD [place], [place] [address]  HISTORICAL INFORMATION:   Selected notes from the MEDICAL RECORD NUMBER Referred by Dr. Zigmund Daniel for Superior RD OD;  LEE- 06.10.19 (JDM) [BCVA OD: 20/LP OD: 20/20]  Ocular Hx- pseudophakia OS (05/2013), cataract OD, superior RD - break within lattice, HTN ret OU, vitreous opacities OU PMH- arthritis, sleep apnea, ulcerative colitis     CURRENT MEDICATIONS: Current Outpatient Medications (Ophthalmic Drugs)  Medication Sig  . LOTEMAX 0.5 % ophthalmic suspension INSTILL 1 DROP INTO LEFT EYE 4 TIMES A DAY  . neomycin-polymyxin-dexameth (MAXITROL) 0.1 % OINT Place 1 application into the right eye 4 (four) times daily.  . prednisoLONE acetate (PRED FORTE) 1 % ophthalmic suspension Place 1 drop into the right eye 4 (four) times daily.   No current facility-administered medications for this visit.  (Ophthalmic Drugs)   Current Outpatient Medications (Other)  Medication Sig  . albuterol (PROVENTIL HFA;VENTOLIN HFA) 108 (90 BASE) MCG/ACT inhaler Inhale 2 puffs into the lungs every 6 (six) hours as needed.    . balsalazide (COLAZAL) 750 MG capsule Take 3 capsules (2,250 mg total) by mouth 3 (three) times daily.  . Calcium Carbonate-Vitamin D (CALCIUM 600+D) 600-400 MG-UNIT per tablet Take 1 tablet by mouth 2 (two) times daily.    . DELZICOL 400 MG CPDR DR capsule TAKE 6 CAPSULES TWICE A DAY  . diltiazem 2 % GEL Apply 1 application topically 2 (two) times daily.  . Fluticasone-Salmeterol (ADVAIR) 100-50 MCG/DOSE AEPB Inhale 1 puff into the lungs 2 (two) times daily.    Marland Kitchen loratadine (LORATADINE ALLERGY RELIEF) 10 MG dissolvable tablet Take 10 mg by mouth daily.    . mesalamine (CANASA) 1000 MG suppository Place 1 suppository (1,000 mg total) rectally as needed.  . montelukast (SINGULAIR) 10 MG tablet Take 10 mg by mouth at bedtime.    . Multiple Vitamin (MULTIVITAMIN) tablet Take 1 tablet by mouth daily.    . nebivolol (BYSTOLIC) 2.5 MG tablet Take 2.5 mg by mouth daily.  . sertraline (ZOLOFT) 50 MG tablet Take 1.5 tablets by mouth daily.  . Simethicone (GAS-X EXTRA STRENGTH) 125 MG CAPS Take by mouth as needed.     No current facility-administered medications for this visit.  (Other)      REVIEW OF SYSTEMS: ROS    Positive for: Musculoskeletal, Eyes   Negative for: Constitutional, Gastrointestinal, Neurological, Skin, Genitourinary, HENT, Endocrine, Cardiovascular, Respiratory, Psychiatric, Allergic/Imm, Heme/Lymph   Last edited by Debbrah Alar, COT on  12/07/2017  8:00 AM. (History)       ALLERGIES Allergies  Allergen Reactions  . Cefaclor Rash    PAST MEDICAL HISTORY Past Medical History:  Diagnosis Date  . Allergic rhinitis   . Allergy   . Anemia    iron deficient  . Arthritis   . Asthma    inhaler  . Cataract    left removed, right still there  . Depression   . Eczema   . History of iron deficiency anemia   . Hx of adenomatous polyp of colon 06/05/2012   2013 single adenoma - repeat colonoscopy 2016-17  . Sleep apnea    wears c-pap  . Ulcerative colitis, left  sided ([place]) 2003   Past Surgical History:  Procedure Laterality Date  . CARPAL TUNNEL RELEASE  2017   bilateral  . CATARACT EXTRACTION Left 05/2013  . COLONOSCOPY  11/10/2008   ulcerative colitis  . COLONOSCOPY    . EYE SURGERY    . TONSILLECTOMY AND ADENOIDECTOMY  1966    FAMILY HISTORY Family History  Problem Relation Age of Onset  . Colon polyps Father   . Diabetes Father   . Heart disease Mother   . Breast cancer Maternal Grandmother   . Clotting disorder Daughter 24       deceased, blood clot  . Colon cancer Neg Hx   . Esophageal cancer Neg Hx   . Rectal cancer Neg Hx   . Stomach cancer Neg Hx     SOCIAL HISTORY Social History   Tobacco Use  . Smoking status: Never Smoker  . Smokeless tobacco: Never Used  Substance Use Topics  . Alcohol use: Yes    Alcohol/week: 0.5 oz    Types: 1 Standard drinks or equivalent per week    Comment: occ  . Drug use: No         OPHTHALMIC EXAM:  Base Eye Exam    Visual Acuity (Snellen - Linear)      Right Left   Dist [place] 20/80 20/20   Dist ph [place] 20/70 -2        Tonometry (Tonopen, 8:04 AM)      Right Left   Pressure 26 19       Tonometry #2      Right Left   Pressure 28        Pupils      Dark Light Shape React APD   Right 5 5 Round NR None   Left 3 2 Round Brisk None       Neuro/Psych    Oriented x3:  Yes   Mood/Affect:  Normal       Dilation    Right eye:  1.0% Mydriacyl, 2.5% Phenylephrine @ 8:04 AM        Slit Lamp and Fundus Exam    External Exam      Right Left   External Periorbital edema        Slit Lamp Exam      Right Left   Lids/Lashes Dermatochalasis - upper lid, mild Meibomian gland dysfunction Dermatochalasis - upper lid, mild Telangiectasia   Conjunctiva/Sclera Fabrica, ST pneumatic Chemosis White and quiet   Cornea Arcus, , 1+ Punctate epithelial erosions Arcus, 3+ central Punctate epithelial erosions, decreased TBUT   Anterior Chamber 2-3+ Cell and pigment Deep and quiet   Iris  Round and moderately dilated to 38m Round and dilated   Lens 2-3+ Nuclear sclerosis, 2+ Cortical cataract - greatest nasally PC IOL in  good position, trace Posterior capsular opacification   Vitreous Vitreous syneresis; single gas bubble superiorly, ~30-35% gas bubble; tr VH Vitreous syneresis       Fundus Exam      Right Left   Disc Hazy view -- improved, Pink and Sharp    C/D Ratio 0.25 0.25   Macula hazy view -- improving, grossly flat, SRF improved    Vessels Normal    Periphery hazy view; Superior retinal detachment from 0900 to 0100 with SRF tracking into macula -- SRF improved, 2 retinal tears at 1030 in line with one another in same meridian, early cryo changes visible in ST quadrant           IMAGING AND PROCEDURES  Imaging and Procedures for @TODAY @  OCT, Retina - OU - Both Eyes       Right Eye Quality was borderline. Central Foveal Thickness: 238. Progression has improved. Findings include subretinal fluid, normal foveal contour, no IRF, outer retinal atrophy (Trace slivers of SRF ).   Left Eye Quality was good. Central Foveal Thickness: 291. Progression has been stable. Findings include normal foveal contour, no IRF, no SRF.   Notes *Images captured and stored on drive  Diagnosis / Impression:  OD: macula and fovea involving superior retinal detachment - retina reattached with trace slivers of SRF OS: NFP, No IRF/SRF  Clinical management:  See below  Abbreviations: NFP - Normal foveal profile. CME - cystoid macular edema. PED - pigment epithelial detachment. IRF - intraretinal fluid. SRF - subretinal fluid. EZ - ellipsoid zone. ERM - epiretinal membrane. ORA - outer retinal atrophy. ORT - outer retinal tubulation. SRHM - subretinal hyper-reflective material          LASER PROCEDURE NOTE  Procedure:  Barrier laser retinopexy using laser indirect ophthalmoscope, RIGHT eye   Diagnosis:   Retinal detachment w/ tear at 1030, RIGHT eye                     S/p  pneumatic cryopexy, RIGHT eye 6.11.19   Surgeon: Bernarda Caffey, MD, PhD  Anesthesia: Topical  Informed consent obtained, operative eye marked, and time out performed prior to initiation of laser.   Laser settings:  Lumenis [id number] laser indirect ophthalmoscope Power: 300 mW Duration: 200 msec  # spots: 255  Placement of laser: Laser was placed in three confluent rows around flap tear at 1 oclock anterior to equator with additional rows anteriorly.  Complications: None. Difficult laser due to cataract and corneal dryness  Patient tolerated the procedure well and received written and verbal post-procedure care information/education.         ASSESSMENT/PLAN:    ICD-10-CM   1. 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 Essential hypertension I10   4. Hypertensive retinopathy of both eyes H35.033   5. Retinal edema H35.81 OCT, Retina - OU - Both Eyes  6. 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 Pseudophakia of left eye Z96.1     1. Macula-involving rhegmatogenous retinal detachment, OD- - RD from 0900 to 0100 with SRF tracking into macula - 2 tears at 1030 meridian  - s/p pneumatic cryopexy OD, 6.11.19  - IOP slightly elevated again today to 26, 28 from 22 - SRF improved with OCT today showing reattachment with tr slivers of SRF - cont  maxitrol ung OD QID  PF gtts QID OD -- STOP  Cosopt OD -- inc to TID  Brimonidine OD -- inc to TID  Atropine OD BID - recommend touch up laser retinopexy OD today (06.14.19) - pt wishes to proceed - RBA of procedure discussed, questions answered - informed consent obtained and signed - see procedure note - F/U Wednesday, 9 am  2. Lattice degeneration w/ atrophic holes, OU - lattice at 12 and 5 oclock - discussed findings, prognosis, and treatment options including observation - recommend laser retinopexy OS once OD more stable  3, 4. Hypertensive retinopathy  OU - discussed importance of tight BP control - monitor  5. Combined form age-related cataract OD-  - The symptoms of cataract, surgical options, and treatments and risks were discussed with patient. - discussed diagnosis and progression - not yet visually significant - monitor for now  6. Pseudophakia OS  - s/p CE/IOL OS [facility])  - beautiful surgery, doing well  - monitor    Ophthalmic Meds Ordered this visit:  No orders of the defined types were placed in this encounter.      No follow-ups on file.  There are no Patient Instructions on file for this visit.   Explained the diagnoses, plan, and follow up with the patient and they expressed understanding.  Patient expressed understanding of the importance of proper follow up care.   This document serves as a record of services personally performed by Gardiner Sleeper, MD, PhD. It was created on their behalf by Catha Brow, [place], a certified ophthalmic assistant. The creation of this record is the provider's dictation and/or activities during the visit.  Electronically signed by: Catha Brow, COA  06.14.19 9:32 AM    Gardiner Sleeper, M.D., Ph.D. Diseases & Surgery of the Retina and Vitreous [facility]   I have reviewed the above documentation for accuracy and completeness, and I agree with the above. Gardiner Sleeper, M.D., Ph.D. 12/07/17 9:43 AM      Abbreviations: M myopia (nearsighted); A astigmatism; H hyperopia (farsighted); P presbyopia; Mrx spectacle prescription;  CTL contact lenses; OD right eye; OS left eye; OU both eyes  XT exotropia; ET esotropia; PEK punctate epithelial keratitis; PEE punctate epithelial erosions; DES dry eye syndrome; MGD meibomian gland dysfunction; ATs artificial tears; PFAT's preservative free artificial tears; [place] nuclear sclerotic cataract; PSC posterior subcapsular cataract; ERM epi-retinal membrane; PVD posterior vitreous detachment; RD retinal  detachment; DM diabetes mellitus; DR diabetic retinopathy; NPDR non-proliferative diabetic retinopathy; PDR proliferative diabetic retinopathy; CSME clinically significant macular edema; DME diabetic macular edema; dbh dot blot hemorrhages; CWS cotton wool spot; POAG primary open angle glaucoma; C/D cup-to-disc ratio; HVF humphrey visual field; GVF goldmann visual field; OCT optical coherence tomography; IOP intraocular pressure; BRVO Branch retinal vein occlusion; CRVO central retinal vein occlusion; CRAO central retinal artery occlusion; BRAO branch retinal artery occlusion; RT retinal tear; SB scleral buckle; PPV pars plana vitrectomy; VH Vitreous hemorrhage; PRP panretinal laser photocoagulation; IVK intravitreal kenalog; VMT vitreomacular traction; MH Macular hole;  NVD neovascularization of the disc; NVE neovascularization elsewhere; AREDS age related eye disease study; ARMD age related macular degeneration; POAG primary open angle glaucoma; EBMD epithelial/anterior basement membrane dystrophy; ACIOL anterior chamber intraocular lens; IOL intraocular lens; PCIOL posterior chamber intraocular lens; Phaco/IOL phacoemulsification with intraocular lens placement; [place] photorefractive keratectomy; LASIK laser assisted in situ keratomileusis; HTN hypertension; DM diabetes mellitus; COPD chronic obstructive pulmonary disease

## 2017-12-11 NOTE — Progress Notes (Signed)
[facility] Note  12/12/2017     CHIEF COMPLAINT Patient presents for Post-op Follow-up s/p pneumatic cryopexy OD 12/04/17  HISTORY OF PRESENT ILLNESS: [name] is a 57 y.o. female who presents to the clinic today for:   HPI    Post-op Follow-up    In right eye.  Discomfort includes itching and floaters.  Negative for pain, foreign body sensation, tearing and discharge.  Vision is stable.  I, the attending physician,  performed the HPI with the patient and updated documentation appropriately.          Comments    Pt presents for s/p pneumatic cryopexy OD (06.11.19), pt states VA is getting better, it is still cloudy, but she is able to see more color now, pt denies pain, wavy vision or flashes, but states she is seeing floaters and OS started itching yesterday, she is using AT's PRN        Last edited by Bernarda Caffey, MD on 12/12/2017  9:17 AM. (History)      Referring physician: Caryl Bis, MD [place], [place] [id number]  HISTORICAL INFORMATION:   Selected notes from the MEDICAL RECORD NUMBER Referred by Dr. Zigmund Daniel for Superior RD OD;  LEE- 06.10.19 (JDM) [BCVA OD: 20/LP OD: 20/20]  Ocular Hx- pseudophakia OS (05/2013), cataract OD, superior RD - break within lattice, HTN ret OU, vitreous opacities OU PMH- arthritis, sleep apnea, ulcerative colitis     CURRENT MEDICATIONS: Current Outpatient Medications (Ophthalmic Drugs)  Medication Sig  . LOTEMAX 0.5 % ophthalmic suspension INSTILL 1 DROP INTO LEFT EYE 4 TIMES A DAY  . neomycin-polymyxin-dexameth (MAXITROL) 0.1 % OINT Place 1 application into the right eye 4 (four) times daily.  . prednisoLONE acetate (PRED FORTE) 1 % ophthalmic suspension Place 1 drop into the right eye 4 (four) times daily.   No current facility-administered medications for this visit.  (Ophthalmic Drugs)   Current Outpatient Medications (Other)  Medication Sig  . albuterol (PROVENTIL HFA;VENTOLIN  HFA) 108 (90 BASE) MCG/ACT inhaler Inhale 2 puffs into the lungs every 6 (six) hours as needed.   . balsalazide (COLAZAL) 750 MG capsule Take 3 capsules (2,250 mg total) by mouth 3 (three) times daily.  . Calcium Carbonate-Vitamin D (CALCIUM 600+D) 600-400 MG-UNIT per tablet Take 1 tablet by mouth 2 (two) times daily.    . DELZICOL 400 MG CPDR DR capsule TAKE 6 CAPSULES TWICE A DAY  . diltiazem 2 % GEL Apply 1 application topically 2 (two) times daily.  . Fluticasone-Salmeterol (ADVAIR) 100-50 MCG/DOSE AEPB Inhale 1 puff into the lungs 2 (two) times daily.    Marland Kitchen loratadine (LORATADINE ALLERGY RELIEF) 10 MG dissolvable tablet Take 10 mg by mouth daily.    . mesalamine (CANASA) 1000 MG suppository Place 1 suppository (1,000 mg total) rectally as needed.  . montelukast (SINGULAIR) 10 MG tablet Take 10 mg by mouth at bedtime.    . Multiple Vitamin (MULTIVITAMIN) tablet Take 1 tablet by mouth daily.    . nebivolol (BYSTOLIC) 2.5 MG tablet Take 2.5 mg by mouth daily.  . phentermine (ADIPEX-P) 37.5 MG tablet Take 37.5 mg by mouth daily.  . sertraline (ZOLOFT) 50 MG tablet Take 1.5 tablets by mouth daily.  . Simethicone (GAS-X EXTRA STRENGTH) 125 MG CAPS Take by mouth as needed.     No current facility-administered medications for this visit.  (Other)      REVIEW OF SYSTEMS: ROS    Positive for:  Musculoskeletal, Eyes, Allergic/Imm   Negative for: Constitutional, Gastrointestinal, Neurological, Skin, Genitourinary, HENT, Endocrine, Cardiovascular, Respiratory, Psychiatric, Heme/Lymph   Last edited by Debbrah Alar, COT on 12/12/2017  9:08 AM. (History)       ALLERGIES Allergies  Allergen Reactions  . Cefaclor Rash    PAST MEDICAL HISTORY Past Medical History:  Diagnosis Date  . Allergic rhinitis   . Allergy   . Anemia    iron deficient  . Arthritis   . Asthma    inhaler  . Cataract    left removed, right still there  . Depression   . Eczema   . History of iron deficiency  anemia   . Hx of adenomatous polyp of colon 06/05/2012   2013 single adenoma - repeat colonoscopy 2016-17  . Sleep apnea    wears c-pap  . Ulcerative colitis, left sided ([place]) 2003   Past Surgical History:  Procedure Laterality Date  . CARPAL TUNNEL RELEASE  2017   bilateral  . CATARACT EXTRACTION Left 05/2013  . COLONOSCOPY  11/10/2008   ulcerative colitis  . COLONOSCOPY    . EYE SURGERY    . TONSILLECTOMY AND ADENOIDECTOMY  1966    FAMILY HISTORY Family History  Problem Relation Age of Onset  . Colon polyps Father   . Diabetes Father   . Heart disease Mother   . Breast cancer Maternal Grandmother   . Clotting disorder Daughter 24       deceased, blood clot  . Colon cancer Neg Hx   . Esophageal cancer Neg Hx   . Rectal cancer Neg Hx   . Stomach cancer Neg Hx     SOCIAL HISTORY Social History   Tobacco Use  . Smoking status: Never Smoker  . Smokeless tobacco: Never Used  Substance Use Topics  . Alcohol use: Yes    Alcohol/week: 0.6 oz    Types: 1 Standard drinks or equivalent per week    Comment: occ  . Drug use: No         OPHTHALMIC EXAM:  Base Eye Exam    Visual Acuity (Snellen - Linear)      Right Left   Dist [place] 20/80 +2 20/20   Dist ph [place] 20/70        Tonometry (Tonopen, 9:06 AM)      Right Left   Pressure 28 18       Tonometry #2 (Tonopen, 9:09 AM)      Right Left   Pressure 28        Pupils      Dark Light Shape React APD   Right 5 5 Round NR None   Left 3 2 Round Slow None       Visual Fields (Counting fingers)      Left Right    Full Full       Extraocular Movement      Right Left    Full, Ortho Full, Ortho       Neuro/Psych    Oriented x3:  Yes   Mood/Affect:  Normal       Dilation    Both eyes:  1.0% Mydriacyl, 2.5% Phenylephrine @ 9:06 AM        Slit Lamp and Fundus Exam    External Exam      Right Left   External Periorbital edema        Slit Lamp Exam      Right Left   Lids/Lashes Dermatochalasis -  upper lid,  mild Meibomian gland dysfunction Dermatochalasis - upper lid, mild Telangiectasia   Conjunctiva/Sclera [place] improving, ST pneumatic Chemosis improving White and quiet   Cornea Arcus, 2+ Punctate epithelial erosions Arcus, 3+ central Punctate epithelial erosions, decreased TBUT   Anterior Chamber Deep and quiet, open angles  Deep and quiet   Iris Round and moderately dilated to 75m Round and dilated   Lens 2-3+ Nuclear sclerosis, 2+ Cortical cataract - greatest nasally PC IOL in good position, trace Posterior capsular opacification   Vitreous Vitreous syneresis; single gas bubble superiorly, ~25-30% gas bubble Vitreous syneresis       Fundus Exam      Right Left   Disc Hazy view -- improved, Pink and Sharp Pink and Sharp   C/D Ratio 0.25 0.25   Macula flat, SRF improved Flat, Good foveal reflex, mild Retinal pigment epithelial mottling, No heme or edema   Vessels Normal Mild Copper wiring, mildly Tortuous   Periphery hazy view; Superior retinal detachment from 0900 to 0100 with SRF tracking into macula -- SRF improved, 2 retinal tears at 1030 in line with one another in same meridian, good cryo and early laser changes visible in ST quadrant Attached, lattice degeneration at 1200 almost to ora, lattice at 0500          IMAGING AND PROCEDURES  Imaging and Procedures for _0 @  OCT, Retina - OU - Both Eyes       Right Eye Quality was good. Central Foveal Thickness: 246. Progression has improved. Findings include subretinal fluid, normal foveal contour, no IRF, outer retinal atrophy (Trace slivers of SRF - improved).   Left Eye Quality was good. Central Foveal Thickness: 286. Progression has been stable. Findings include normal foveal contour, no IRF, no SRF.   Notes *Images captured and stored on drive  Diagnosis / Impression:  OD: macula and fovea involving superior retinal detachment - retina reattached with trace slivers of SRF - improved OS: NFP, No  IRF/SRF  Clinical management:  See below  Abbreviations: NFP - Normal foveal profile. CME - cystoid macular edema. PED - pigment epithelial detachment. IRF - intraretinal fluid. SRF - subretinal fluid. EZ - ellipsoid zone. ERM - epiretinal membrane. ORA - outer retinal atrophy. ORT - outer retinal tubulation. SRHM - subretinal hyper-reflective material                   ASSESSMENT/PLAN:    ICD-10-CM   1. 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 Essential hypertension I10   4. Hypertensive retinopathy of both eyes H35.033   5. Retinal edema H35.81 OCT, Retina - OU - Both Eyes  6. 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 Pseudophakia of left eye Z96.1     1. Macula-involving rhegmatogenous retinal detachment, OD- - RD from 0900 to 0100 with SRF tracking into macula - 2 tears at 1030 meridian  - s/p pneumatic cryopexy OD (6.11.19)  - s/p touch up laser retinopexy OD (06.14.19) - IOP remains slightly elevated 28 from 22 - SRF improved with OCT today showing reattachment with tr slivers of SRF - cont  maxitrol ung OD -- reduce to PRN  Cosopt OD TID  Brimonidine OD TID  Atropine OD BID - F/U 1 week  2. Lattice degeneration w/ atrophic holes, OU - lattice at 12 and 5 oclock - discussed findings, prognosis, and treatment options including observation - recommend laser retinopexy OS once OD more stable  3, 4. Hypertensive retinopathy OU - discussed importance of tight BP control - monitor  5. Combined form age-related cataract OD-  - The symptoms of cataract, surgical options, and treatments and risks were discussed with patient. - discussed diagnosis and progression - not yet visually significant - monitor for now  6. Pseudophakia OS  - s/p CE/IOL OS [facility])  - beautiful surgery, doing well  - monitor    Ophthalmic Meds Ordered this visit:  No orders of the defined types were  placed in this encounter.      Return in about 1 week (around 12/19/2017) for POV .  There are no Patient Instructions on file for this visit.   Explained the diagnoses, plan, and follow up with the patient and they expressed understanding.  Patient expressed understanding of the importance of proper follow up care.   This document serves as a record of services personally performed by Gardiner Sleeper, MD, PhD. It was created on their behalf by Ernest Mallick, OA, an ophthalmic assistant. The creation of this record is the provider's dictation and/or activities during the visit.    Electronically signed by: Ernest Mallick, OA  06.18.2019 10:29 AM   This document serves as a record of services personally performed by Gardiner Sleeper, MD, PhD. It was created on their behalf by Catha Brow, [place], a certified ophthalmic assistant. The creation of this record is the provider's dictation and/or activities during the visit.  Electronically signed by: Catha Brow, [place]  06.19.19 10:29 AM   Gardiner Sleeper, M.D., Ph.D. Diseases & Surgery of the Retina and Vitreous [facility]  I have reviewed the above documentation for accuracy and completeness, and I agree with the above. Gardiner Sleeper, M.D., Ph.D. 12/12/17 10:30 AM    Abbreviations: M myopia (nearsighted); A astigmatism; H hyperopia (farsighted); P presbyopia; Mrx spectacle prescription;  CTL contact lenses; OD right eye; OS left eye; OU both eyes  XT exotropia; ET esotropia; PEK punctate epithelial keratitis; PEE punctate epithelial erosions; DES dry eye syndrome; MGD meibomian gland dysfunction; ATs artificial tears; PFAT's preservative free artificial tears; [place] nuclear sclerotic cataract; PSC posterior subcapsular cataract; ERM epi-retinal membrane; PVD posterior vitreous detachment; RD retinal detachment; DM diabetes mellitus; DR diabetic retinopathy; NPDR non-proliferative diabetic retinopathy; PDR  proliferative diabetic retinopathy; CSME clinically significant macular edema; DME diabetic macular edema; dbh dot blot hemorrhages; CWS cotton wool spot; POAG primary open angle glaucoma; C/D cup-to-disc ratio; HVF humphrey visual field; GVF goldmann visual field; OCT optical coherence tomography; IOP intraocular pressure; BRVO Branch retinal vein occlusion; CRVO central retinal vein occlusion; CRAO central retinal artery occlusion; BRAO branch retinal artery occlusion; RT retinal tear; SB scleral buckle; PPV pars plana vitrectomy; VH Vitreous hemorrhage; PRP panretinal laser photocoagulation; IVK intravitreal kenalog; VMT vitreomacular traction; MH Macular hole;  NVD neovascularization of the disc; NVE neovascularization elsewhere; AREDS age related eye disease study; ARMD age related macular degeneration; POAG primary open angle glaucoma; EBMD epithelial/anterior basement membrane dystrophy; ACIOL anterior chamber intraocular lens; IOL intraocular lens; PCIOL posterior chamber intraocular lens; Phaco/IOL phacoemulsification with intraocular lens placement; [place] photorefractive keratectomy; LASIK laser assisted in situ keratomileusis; HTN hypertension; DM diabetes mellitus; COPD chronic obstructive pulmonary disease

## 2017-12-12 ENCOUNTER — Ambulatory Visit (INDEPENDENT_AMBULATORY_CARE_PROVIDER_SITE_OTHER): Payer: BLUE CROSS/BLUE SHIELD | Admitting: Ophthalmology

## 2017-12-12 ENCOUNTER — Encounter (INDEPENDENT_AMBULATORY_CARE_PROVIDER_SITE_OTHER): Payer: Self-pay | Admitting: Ophthalmology

## 2017-12-12 DIAGNOSIS — H25811 Combined forms of age-related cataract, right eye: Secondary | ICD-10-CM

## 2017-12-12 DIAGNOSIS — H3321 Serous retinal detachment, right eye: Secondary | ICD-10-CM

## 2017-12-12 DIAGNOSIS — H3581 Retinal edema: Secondary | ICD-10-CM | POA: Diagnosis not present

## 2017-12-12 DIAGNOSIS — H35413 Lattice degeneration of retina, bilateral: Secondary | ICD-10-CM

## 2017-12-12 DIAGNOSIS — I10 Essential (primary) hypertension: Secondary | ICD-10-CM

## 2017-12-12 DIAGNOSIS — H35033 Hypertensive retinopathy, bilateral: Secondary | ICD-10-CM

## 2017-12-12 DIAGNOSIS — Z961 Presence of intraocular lens: Secondary | ICD-10-CM

## 2017-12-18 NOTE — Progress Notes (Signed)
[facility] Note  12/19/2017     CHIEF COMPLAINT Patient presents for Post-op Follow-up s/p pneumatic cryopexy OD 12/04/17  HISTORY OF PRESENT ILLNESS: [name] is a 57 y.o. female who presents to the clinic today for:   HPI    Post-op Follow-up    In right eye.  Discomfort includes none.  Negative for pain, itching, foreign body sensation, tearing, discharge and floaters.  Vision is stable.  I, the attending physician,  performed the HPI with the patient and updated documentation appropriately.          Comments    POV S/P Pneumatic cryopexy OD (12/04/17). Patient states she can see color better,images continue to be distorted,floaters are smaller dots and flashes have faded away ,she states her vision is gradually getting better. Gas bubble is "smaller" located in the middle of central vision. Patient is compliant with eye gtt's, she has stopped Pred forte and antibiotic ointment.       Last edited by Bernarda Caffey, MD on 12/19/2017 12:29 PM. (History)      Referring physician: Caryl Bis, MD [place], [place] [address]  HISTORICAL INFORMATION:   Selected notes from the MEDICAL RECORD NUMBER Referred by Dr. Zigmund Daniel for Superior RD OD;  LEE- 06.10.19 (JDM) [BCVA OD: 20/LP OD: 20/20]  Ocular Hx- pseudophakia OS (05/2013), cataract OD, superior RD - break within lattice, HTN ret OU, vitreous opacities OU PMH- arthritis, sleep apnea, ulcerative colitis     CURRENT MEDICATIONS: Current Outpatient Medications (Ophthalmic Drugs)  Medication Sig  . LOTEMAX 0.5 % ophthalmic suspension INSTILL 1 DROP INTO LEFT EYE 4 TIMES A DAY  . prednisoLONE acetate (PRED FORTE) 1 % ophthalmic suspension Place 1 drop into the right eye 4 (four) times daily.  Marland Kitchen neomycin-polymyxin-dexameth (MAXITROL) 0.1 % OINT Place 1 application into the right eye 4 (four) times daily. (Patient not taking: Reported on 12/19/2017)   No current facility-administered  medications for this visit.  (Ophthalmic Drugs)   Current Outpatient Medications (Other)  Medication Sig  . albuterol (PROVENTIL HFA;VENTOLIN HFA) 108 (90 BASE) MCG/ACT inhaler Inhale 2 puffs into the lungs every 6 (six) hours as needed.   . balsalazide (COLAZAL) 750 MG capsule Take 3 capsules (2,250 mg total) by mouth 3 (three) times daily.  . Calcium Carbonate-Vitamin D (CALCIUM 600+D) 600-400 MG-UNIT per tablet Take 1 tablet by mouth 2 (two) times daily.    . DELZICOL 400 MG CPDR DR capsule TAKE 6 CAPSULES TWICE A DAY  . diltiazem 2 % GEL Apply 1 application topically 2 (two) times daily.  . Fluticasone-Salmeterol (ADVAIR) 100-50 MCG/DOSE AEPB Inhale 1 puff into the lungs 2 (two) times daily.    Marland Kitchen loratadine (LORATADINE ALLERGY RELIEF) 10 MG dissolvable tablet Take 10 mg by mouth daily.    . mesalamine (CANASA) 1000 MG suppository Place 1 suppository (1,000 mg total) rectally as needed.  . montelukast (SINGULAIR) 10 MG tablet Take 10 mg by mouth at bedtime.    . Multiple Vitamin (MULTIVITAMIN) tablet Take 1 tablet by mouth daily.    . nebivolol (BYSTOLIC) 2.5 MG tablet Take 2.5 mg by mouth daily.  . phentermine (ADIPEX-P) 37.5 MG tablet Take 37.5 mg by mouth daily.  . sertraline (ZOLOFT) 50 MG tablet Take 1.5 tablets by mouth daily.  . Simethicone (GAS-X EXTRA STRENGTH) 125 MG CAPS Take by mouth as needed.     No current facility-administered medications for this visit.  (Other)  REVIEW OF SYSTEMS: ROS    Positive for: Eyes   Negative for: Constitutional, Gastrointestinal, Neurological, Skin, Genitourinary, Musculoskeletal, HENT, Endocrine, Cardiovascular, Respiratory, Psychiatric, Allergic/Imm, Heme/Lymph   Last edited by Zenovia Jordan, LPN on 11/30/3014  0:10 AM. (History)       ALLERGIES Allergies  Allergen Reactions  . Cefaclor Rash    PAST MEDICAL HISTORY Past Medical History:  Diagnosis Date  . Allergic rhinitis   . Allergy   . Anemia    iron deficient  .  Arthritis   . Asthma    inhaler  . Cataract    left removed, right still there  . Depression   . Eczema   . History of iron deficiency anemia   . Hx of adenomatous polyp of colon 06/05/2012   2013 single adenoma - repeat colonoscopy 2016-17  . Sleep apnea    wears c-pap  . Ulcerative colitis, left sided ([facility]) 2003   Past Surgical History:  Procedure Laterality Date  . CARPAL TUNNEL RELEASE  2017   bilateral  . CATARACT EXTRACTION Left 05/2013  . COLONOSCOPY  11/10/2008   ulcerative colitis  . COLONOSCOPY    . EYE SURGERY    . TONSILLECTOMY AND ADENOIDECTOMY  1966    FAMILY HISTORY Family History  Problem Relation Age of Onset  . Colon polyps Father   . Diabetes Father   . Heart disease Mother   . Breast cancer Maternal Grandmother   . Clotting disorder Daughter 24       deceased, blood clot  . Colon cancer Neg Hx   . Esophageal cancer Neg Hx   . Rectal cancer Neg Hx   . Stomach cancer Neg Hx     SOCIAL HISTORY Social History   Tobacco Use  . Smoking status: Never Smoker  . Smokeless tobacco: Never Used  Substance Use Topics  . Alcohol use: Yes    Alcohol/week: 0.6 oz    Types: 1 Standard drinks or equivalent per week    Comment: occ  . Drug use: No         OPHTHALMIC EXAM:  Base Eye Exam    Visual Acuity (Snellen - Linear)      Right Left   Dist [place] 20/150 20/20   Dist ph [place] 20/70 NI       Tonometry (Tonopen, 8:53 AM)      Right Left   Pressure 17 17       Pupils      Dark Light Shape React APD   Right 3 2 Round Brisk None   Left 3 2 Round Brisk None       Visual Fields (Counting fingers)      Left Right    Full Full       Extraocular Movement      Right Left    Full, Ortho Full, Ortho       Neuro/Psych    Oriented x3:  Yes   Mood/Affect:  Normal       Dilation    Both eyes:  1.0% Mydriacyl, Paremyd @ 8:54 AM        Slit Lamp and Fundus Exam    External Exam      Right Left   External Periorbital edema        Slit  Lamp Exam      Right Left   Lids/Lashes Dermatochalasis - upper lid, mild Meibomian gland dysfunction Dermatochalasis - upper lid, mild Telangiectasia   Conjunctiva/Sclera [place] clearing ST quadrant  White and quiet   Cornea Arcus, 2+ diffuse Punctate epithelial erosions Arcus, 3+ central Punctate epithelial erosions, decreased TBUT   Anterior Chamber Deep and quiet, open angles  Deep and quiet   Iris Round and moderately dilated to 26m Round and dilated   Lens 2-3+ Nuclear sclerosis, 2+ Cortical cataract - greatest nasally PC IOL in good position, trace Posterior capsular opacification   Vitreous Vitreous syneresis; single gas bubble superiorly, ~20-25% gas bubble, inferior vitreous debris Vitreous syneresis       Fundus Exam      Right Left   Disc Pink and Sharp Pink and Sharp   C/D Ratio 0.25 0.25   Macula flat, SRF improved, Retinal pigment epithelial mottling, No heme or edema, mild demarkation line IN to fovea Flat, Good foveal reflex, mild Retinal pigment epithelial mottling, No heme or edema   Vessels Normal Mild Copper wiring, mildly Tortuous   Periphery Superior retinal detachment w/ SRF improved, 2 retinal tears at 1030 in line with one another in same meridian, good cryo and  laser changes visible in ST quadrant Attached, lattice degeneration at 1200 almost to ora, lattice at 0500          IMAGING AND PROCEDURES  Imaging and Procedures for @TODAY @  OCT, Retina - OU - Both Eyes       Right Eye Quality was good. Central Foveal Thickness: 244. Progression has been stable. Findings include subretinal fluid, normal foveal contour, no IRF, outer retinal atrophy (Trace slivers of SRF - stable).   Left Eye Quality was good. Central Foveal Thickness: 287. Progression has been stable. Findings include normal foveal contour, no IRF, no SRF (Trace ERM).   Notes *Images captured and stored on drive  Diagnosis / Impression:  OD: macula and fovea involving superior retinal  detachment - retina reattached with trace slivers of SRF - stable OS: NFP, No IRF/SRF  Clinical management:  See below  Abbreviations: NFP - Normal foveal profile. CME - cystoid macular edema. PED - pigment epithelial detachment. IRF - intraretinal fluid. SRF - subretinal fluid. EZ - ellipsoid zone. ERM - epiretinal membrane. ORA - outer retinal atrophy. ORT - outer retinal tubulation. SRHM - subretinal hyper-reflective material                   ASSESSMENT/PLAN:    ICD-10-CM   1. 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 Essential hypertension I10   4. Hypertensive retinopathy of both eyes H35.033   5. Retinal edema H35.81 OCT, Retina - OU - Both Eyes  6. 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 Pseudophakia of left eye Z96.1     1. Macula-involving rhegmatogenous retinal detachment, OD- - RD from 0900 to 0100 with SRF tracking into macula - 2 tears at 1030 meridian  - s/p pneumatic cryopexy OD (6.11.19)  - s/p touch up laser retinopexy OD (06.14.19) - BCVA 20/70 - IOP improved - SRF improved with OCT today showing reattachment with tr slivers of SRF - cont  Brimonidine OD TID  maxitrol ung OD prn  Cosopt OD TID -- STOP  Atropine OD BID -- STOP - post op instructions reviewed and new drop sheet given - F/U 1-2 weeks  2. Lattice degeneration w/ atrophic holes, OU - lattice at 12 and 5 oclock - discussed findings, prognosis, and treatment options including observation - recommend laser retinopexy OS once OD more  stable and gas bubble gone  3, 4. Hypertensive retinopathy OU - discussed importance of tight BP control - monitor  5. Combined form age-related cataract OD-  - The symptoms of cataract, surgical options, and treatments and risks were discussed with patient. - discussed diagnosis and progression - not yet visually significant - monitor for now  6.  Pseudophakia OS  - s/p CE/IOL OS [facility])  - beautiful surgery, doing well  - monitor    Ophthalmic Meds Ordered this visit:  No orders of the defined types were placed in this encounter.      Return in about 2 weeks (around 01/02/2018) for POV .  There are no Patient Instructions on file for this visit.   Explained the diagnoses, plan, and follow up with the patient and they expressed understanding.  Patient expressed understanding of the importance of proper follow up care.   This document serves as a record of services personally performed by Gardiner Sleeper, MD, PhD. It was created on their behalf by Catha Brow, [place], a certified ophthalmic assistant. The creation of this record is the provider's dictation and/or activities during the visit.  Electronically signed by: Catha Brow, [place]  06.25.19 12:30 PM    Gardiner Sleeper, M.D., Ph.D. Diseases & Surgery of the Retina and Vitreous [facility]  I have reviewed the above documentation for accuracy and completeness, and I agree with the above. Gardiner Sleeper, M.D., Ph.D. 12/19/17 12:31 PM    Abbreviations: M myopia (nearsighted); A astigmatism; H hyperopia (farsighted); P presbyopia; Mrx spectacle prescription;  CTL contact lenses; OD right eye; OS left eye; OU both eyes  XT exotropia; ET esotropia; PEK punctate epithelial keratitis; PEE punctate epithelial erosions; DES dry eye syndrome; MGD meibomian gland dysfunction; ATs artificial tears; PFAT's preservative free artificial tears; [place] nuclear sclerotic cataract; PSC posterior subcapsular cataract; ERM epi-retinal membrane; PVD posterior vitreous detachment; RD retinal detachment; DM diabetes mellitus; DR diabetic retinopathy; NPDR non-proliferative diabetic retinopathy; PDR proliferative diabetic retinopathy; CSME clinically significant macular edema; DME diabetic macular edema; dbh dot blot hemorrhages; CWS cotton wool spot; POAG primary  open angle glaucoma; C/D cup-to-disc ratio; HVF humphrey visual field; GVF goldmann visual field; OCT optical coherence tomography; IOP intraocular pressure; BRVO Branch retinal vein occlusion; CRVO central retinal vein occlusion; CRAO central retinal artery occlusion; BRAO branch retinal artery occlusion; RT retinal tear; SB scleral buckle; PPV pars plana vitrectomy; VH Vitreous hemorrhage; PRP panretinal laser photocoagulation; IVK intravitreal kenalog; VMT vitreomacular traction; MH Macular hole;  NVD neovascularization of the disc; NVE neovascularization elsewhere; AREDS age related eye disease study; ARMD age related macular degeneration; POAG primary open angle glaucoma; EBMD epithelial/anterior basement membrane dystrophy; ACIOL anterior chamber intraocular lens; IOL intraocular lens; PCIOL posterior chamber intraocular lens; Phaco/IOL phacoemulsification with intraocular lens placement; [place] photorefractive keratectomy; LASIK laser assisted in situ keratomileusis; HTN hypertension; DM diabetes mellitus; COPD chronic obstructive pulmonary disease

## 2017-12-19 ENCOUNTER — Encounter (INDEPENDENT_AMBULATORY_CARE_PROVIDER_SITE_OTHER): Payer: Self-pay | Admitting: Ophthalmology

## 2017-12-19 ENCOUNTER — Ambulatory Visit (INDEPENDENT_AMBULATORY_CARE_PROVIDER_SITE_OTHER): Payer: BLUE CROSS/BLUE SHIELD | Admitting: Ophthalmology

## 2017-12-19 DIAGNOSIS — H3321 Serous retinal detachment, right eye: Secondary | ICD-10-CM | POA: Diagnosis not present

## 2017-12-19 DIAGNOSIS — H3581 Retinal edema: Secondary | ICD-10-CM | POA: Diagnosis not present

## 2017-12-19 DIAGNOSIS — I10 Essential (primary) hypertension: Secondary | ICD-10-CM

## 2017-12-19 DIAGNOSIS — H25811 Combined forms of age-related cataract, right eye: Secondary | ICD-10-CM

## 2017-12-19 DIAGNOSIS — Z961 Presence of intraocular lens: Secondary | ICD-10-CM

## 2017-12-19 DIAGNOSIS — H35413 Lattice degeneration of retina, bilateral: Secondary | ICD-10-CM

## 2017-12-19 DIAGNOSIS — H35033 Hypertensive retinopathy, bilateral: Secondary | ICD-10-CM

## 2018-01-01 NOTE — Progress Notes (Signed)
[facility] Note  01/02/2018     CHIEF COMPLAINT Patient presents for Post-op Follow-up s/p pneumatic cryopexy OD 12/04/17  HISTORY OF PRESENT ILLNESS: [name] is a 57 y.o. female who presents to the clinic today for:   HPI    Post-op Follow-up    In right eye.  Discomfort includes floaters.  Negative for pain, itching, foreign body sensation, tearing, discharge and none.  Vision is stable.  I, the attending physician,  performed the HPI with the patient and updated documentation appropriately.          Comments    Pt presents for RD F/U, pt states she feels like VA is improving, but hasnt noticed a significant change since last visit, she is experiencing floaters OD, but denies pain, wavy vision or flashes, she is still using gtts as directed, pt states gas bubble appears smaller       Last edited by Cherrie Gauze, COA on 01/02/2018  8:25 AM. (History)      Referring physician: Caryl Bis, MD [place], [place] [address]  HISTORICAL INFORMATION:   Selected notes from the MEDICAL RECORD NUMBER Referred by Dr. Zigmund Daniel for Superior RD OD;  LEE- 06.10.19 (JDM) [BCVA OD: 20/LP OD: 20/20]  Ocular Hx- pseudophakia OS (05/2013), cataract OD, superior RD - break within lattice, HTN ret OU, vitreous opacities OU PMH- arthritis, sleep apnea, ulcerative colitis     CURRENT MEDICATIONS: Current Outpatient Medications (Ophthalmic Drugs)  Medication Sig  . LOTEMAX 0.5 % ophthalmic suspension INSTILL 1 DROP INTO LEFT EYE 4 TIMES A DAY  . neomycin-polymyxin-dexameth (MAXITROL) 0.1 % OINT Place 1 application into the right eye 4 (four) times daily. (Patient not taking: Reported on 12/19/2017)  . prednisoLONE acetate (PRED FORTE) 1 % ophthalmic suspension Place 1 drop into the right eye 4 (four) times daily.   No current facility-administered medications for this visit.  (Ophthalmic Drugs)   Current Outpatient Medications (Other)   Medication Sig  . albuterol (PROVENTIL HFA;VENTOLIN HFA) 108 (90 BASE) MCG/ACT inhaler Inhale 2 puffs into the lungs every 6 (six) hours as needed.   . balsalazide (COLAZAL) 750 MG capsule Take 3 capsules (2,250 mg total) by mouth 3 (three) times daily.  . Calcium Carbonate-Vitamin D (CALCIUM 600+D) 600-400 MG-UNIT per tablet Take 1 tablet by mouth 2 (two) times daily.    . DELZICOL 400 MG CPDR DR capsule TAKE 6 CAPSULES TWICE A DAY  . diltiazem 2 % GEL Apply 1 application topically 2 (two) times daily.  . Fluticasone-Salmeterol (ADVAIR) 100-50 MCG/DOSE AEPB Inhale 1 puff into the lungs 2 (two) times daily.    Marland Kitchen loratadine (LORATADINE ALLERGY RELIEF) 10 MG dissolvable tablet Take 10 mg by mouth daily.    . mesalamine (CANASA) 1000 MG suppository Place 1 suppository (1,000 mg total) rectally as needed.  . montelukast (SINGULAIR) 10 MG tablet Take 10 mg by mouth at bedtime.    . Multiple Vitamin (MULTIVITAMIN) tablet Take 1 tablet by mouth daily.    . nebivolol (BYSTOLIC) 2.5 MG tablet Take 2.5 mg by mouth daily.  . phentermine (ADIPEX-P) 37.5 MG tablet Take 37.5 mg by mouth daily.  . sertraline (ZOLOFT) 50 MG tablet Take 1.5 tablets by mouth daily.  . Simethicone (GAS-X EXTRA STRENGTH) 125 MG CAPS Take by mouth as needed.     No current facility-administered medications for this visit.  (Other)      REVIEW OF SYSTEMS: ROS  Positive for: Eyes   Negative for: Constitutional, Gastrointestinal, Neurological, Skin, Genitourinary, Musculoskeletal, HENT, Endocrine, Cardiovascular, Respiratory, Psychiatric, Allergic/Imm, Heme/Lymph   Last edited by Debbrah Alar, COT on 01/02/2018  8:12 AM. (History)       ALLERGIES Allergies  Allergen Reactions  . Cefaclor Rash    PAST MEDICAL HISTORY Past Medical History:  Diagnosis Date  . Allergic rhinitis   . Allergy   . Anemia    iron deficient  . Arthritis   . Asthma    inhaler  . Cataract    left removed, right still there  .  Depression   . Eczema   . History of iron deficiency anemia   . Hx of adenomatous polyp of colon 06/05/2012   2013 single adenoma - repeat colonoscopy 2016-17  . Sleep apnea    wears c-pap  . Ulcerative colitis, left sided ([place]) 2003   Past Surgical History:  Procedure Laterality Date  . CARPAL TUNNEL RELEASE  2017   bilateral  . CATARACT EXTRACTION Left 05/2013  . COLONOSCOPY  11/10/2008   ulcerative colitis  . COLONOSCOPY    . EYE SURGERY    . TONSILLECTOMY AND ADENOIDECTOMY  1966    FAMILY HISTORY Family History  Problem Relation Age of Onset  . Colon polyps Father   . Diabetes Father   . Heart disease Mother   . Breast cancer Maternal Grandmother   . Clotting disorder Daughter 24       deceased, blood clot  . Colon cancer Neg Hx   . Esophageal cancer Neg Hx   . Rectal cancer Neg Hx   . Stomach cancer Neg Hx     SOCIAL HISTORY Social History   Tobacco Use  . Smoking status: Never Smoker  . Smokeless tobacco: Never Used  Substance Use Topics  . Alcohol use: Yes    Alcohol/week: 0.6 oz    Types: 1 Standard drinks or equivalent per week    Comment: occ  . Drug use: No         OPHTHALMIC EXAM:  Base Eye Exam    Visual Acuity (Snellen - Linear)      Right Left   Dist [place] 20/60 20/20 -2   Dist ph [place] 20/40 -1 20/20       Tonometry (Tonopen, 8:17 AM)      Right Left   Pressure 12 13       Pupils      Dark Light Shape React APD   Right 4 3 Round Brisk None   Left 4 2 Round Brisk None       Neuro/Psych    Oriented x3:  Yes   Mood/Affect:  Normal       Dilation    Both eyes:  1.0% Mydriacyl, 2.5% Phenylephrine @ 8:17 AM          IMAGING AND PROCEDURES  Imaging and Procedures for _0 @  OCT, Retina - OU - Both Eyes       Right Eye Quality was good. Central Foveal Thickness: 253. Progression has been stable. Findings include normal foveal contour, no IRF, outer retinal atrophy, no SRF (Trace slivers of SRF - improved, mild interval  improvement in central ellipsoid).   Left Eye Quality was good. Central Foveal Thickness: 285. Progression has been stable. Findings include normal foveal contour, no IRF, no SRF (Trace ERM).   Notes *Images captured and stored on drive  Diagnosis / Impression:  OD: macula and fovea involving superior retinal detachment -  retina reattached with trace slivers of SRF - improved OS: NFP, No IRF/SRF  Clinical management:  See below  Abbreviations: NFP - Normal foveal profile. CME - cystoid macular edema. PED - pigment epithelial detachment. IRF - intraretinal fluid. SRF - subretinal fluid. EZ - ellipsoid zone. ERM - epiretinal membrane. ORA - outer retinal atrophy. ORT - outer retinal tubulation. SRHM - subretinal hyper-reflective material                   ASSESSMENT/PLAN:    ICD-10-CM   1. 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 Essential hypertension I10   4. Hypertensive retinopathy of both eyes H35.033   5. Retinal edema H35.81 OCT, Retina - OU - Both Eyes  6. 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 Pseudophakia of left eye Z96.1     1. Macula-involving rhegmatogenous retinal detachment, OD- - RD from 0900 to 0100 with SRF tracking into macula - 2 tears at 1030 meridian  - s/p pneumatic cryopexy OD (6.11.19)  - s/p touch up laser retinopexy OD (06.14.19) - BCVA improved to 20/40 - IOP improved - gas bubble almost gone - SRF improved with OCT today showing reattachment with tr slivers of SRF - dec  Brimonidine OD QD  maxitrol ung OD prn - post op instructions reviewed and new drop sheet given - F/U 2 weeks -- plan for laser retinopexy OS  2. Lattice degeneration w/ atrophic holes, OU - lattice at 12 and 5 oclock - discussed findings, prognosis, and treatment options including observation - recommend laser retinopexy OS once OD more stable and gas bubble  gone - F/U 2 weeks -- plan for laser retinopexy OS  3, 4. Hypertensive retinopathy OU - discussed importance of tight BP control - monitor  5. Combined form age-related cataract OD-  - The symptoms of cataract, surgical options, and treatments and risks were discussed with patient. - discussed diagnosis and progression - not yet visually significant - monitor for now  6. Pseudophakia OS  - s/p CE/IOL OS [facility])  - beautiful surgery, doing well  - monitor    Ophthalmic Meds Ordered this visit:  No orders of the defined types were placed in this encounter.      No follow-ups on file.  There are no Patient Instructions on file for this visit.   Explained the diagnoses, plan, and follow up with the patient and they expressed understanding.  Patient expressed understanding of the importance of proper follow up care.   This document serves as a record of services personally performed by Gardiner Sleeper, MD, PhD. It was created on their behalf by Catha Brow, [place], a certified ophthalmic assistant. The creation of this record is the provider's dictation and/or activities during the visit.  Electronically signed by: Catha Brow, COA  07.09.19 8:31 AM    Gardiner Sleeper, M.D., Ph.D. Diseases & Surgery of the Retina and Vitreous [facility]  I have reviewed the above documentation for accuracy and completeness, and I agree with the above. Gardiner Sleeper, M.D., Ph.D. 01/02/18 9:03 AM    Abbreviations: M myopia (nearsighted); A astigmatism; H hyperopia (farsighted); P presbyopia; Mrx spectacle prescription;  CTL contact lenses; OD right eye; OS left eye; OU both eyes  XT exotropia; ET esotropia; PEK punctate epithelial keratitis; PEE punctate epithelial erosions; DES dry eye syndrome; MGD meibomian  gland dysfunction; ATs artificial tears; PFAT's preservative free artificial tears; [place] nuclear sclerotic cataract; PSC posterior subcapsular  cataract; ERM epi-retinal membrane; PVD posterior vitreous detachment; RD retinal detachment; DM diabetes mellitus; DR diabetic retinopathy; NPDR non-proliferative diabetic retinopathy; PDR proliferative diabetic retinopathy; CSME clinically significant macular edema; DME diabetic macular edema; dbh dot blot hemorrhages; CWS cotton wool spot; POAG primary open angle glaucoma; C/D cup-to-disc ratio; HVF humphrey visual field; GVF goldmann visual field; OCT optical coherence tomography; IOP intraocular pressure; BRVO Branch retinal vein occlusion; CRVO central retinal vein occlusion; CRAO central retinal artery occlusion; BRAO branch retinal artery occlusion; RT retinal tear; SB scleral buckle; PPV pars plana vitrectomy; VH Vitreous hemorrhage; PRP panretinal laser photocoagulation; IVK intravitreal kenalog; VMT vitreomacular traction; MH Macular hole;  NVD neovascularization of the disc; NVE neovascularization elsewhere; AREDS age related eye disease study; ARMD age related macular degeneration; POAG primary open angle glaucoma; EBMD epithelial/anterior basement membrane dystrophy; ACIOL anterior chamber intraocular lens; IOL intraocular lens; PCIOL posterior chamber intraocular lens; Phaco/IOL phacoemulsification with intraocular lens placement; [place] photorefractive keratectomy; LASIK laser assisted in situ keratomileusis; HTN hypertension; DM diabetes mellitus; COPD chronic obstructive pulmonary disease

## 2018-01-02 ENCOUNTER — Encounter (INDEPENDENT_AMBULATORY_CARE_PROVIDER_SITE_OTHER): Payer: Self-pay | Admitting: Ophthalmology

## 2018-01-02 ENCOUNTER — Ambulatory Visit (INDEPENDENT_AMBULATORY_CARE_PROVIDER_SITE_OTHER): Payer: BLUE CROSS/BLUE SHIELD | Admitting: Ophthalmology

## 2018-01-02 DIAGNOSIS — H35413 Lattice degeneration of retina, bilateral: Secondary | ICD-10-CM

## 2018-01-02 DIAGNOSIS — H35033 Hypertensive retinopathy, bilateral: Secondary | ICD-10-CM

## 2018-01-02 DIAGNOSIS — I10 Essential (primary) hypertension: Secondary | ICD-10-CM

## 2018-01-02 DIAGNOSIS — H3581 Retinal edema: Secondary | ICD-10-CM | POA: Diagnosis not present

## 2018-01-02 DIAGNOSIS — Z961 Presence of intraocular lens: Secondary | ICD-10-CM

## 2018-01-02 DIAGNOSIS — H3321 Serous retinal detachment, right eye: Secondary | ICD-10-CM | POA: Diagnosis not present

## 2018-01-02 DIAGNOSIS — H25811 Combined forms of age-related cataract, right eye: Secondary | ICD-10-CM

## 2018-01-14 NOTE — Progress Notes (Signed)
[facility] Note  01/16/2018     CHIEF COMPLAINT Patient presents for Post-op Follow-up s/p pneumatic cryopexy OD 12/04/17  HISTORY OF PRESENT ILLNESS: [name] is a 57 y.o. female who presents to the clinic today for:   HPI    Post-op Follow-up    In right eye.  Discomfort includes floaters.  Negative for pain, itching, foreign body sensation, tearing, discharge and none.  Vision is stable.  I, the attending physician,  performed the HPI with the patient and updated documentation appropriately.          Comments    Pt presents for RD OS f/u, pt states VA seems the same as last time, she states the gas bubble has gone away, she is seeing a few floaters, but not FOL, pain or wavy vision, pt is still using brimonidine gtts as directed       Last edited by Bernarda Caffey, MD on 01/16/2018  8:16 AM. (History)      Referring physician: Caryl Bis, MD [place], [place] [address]  HISTORICAL INFORMATION:   Selected notes from the MEDICAL RECORD NUMBER Referred by Dr. Zigmund Daniel for Superior RD OD;  LEE- 06.10.19 (JDM) [BCVA OD: 20/LP OD: 20/20]  Ocular Hx- pseudophakia OS (05/2013), cataract OD, superior RD - break within lattice, HTN ret OU, vitreous opacities OU PMH- arthritis, sleep apnea, ulcerative colitis     CURRENT MEDICATIONS: Current Outpatient Medications (Ophthalmic Drugs)  Medication Sig  . LOTEMAX 0.5 % ophthalmic suspension INSTILL 1 DROP INTO LEFT EYE 4 TIMES A DAY  . neomycin-polymyxin-dexameth (MAXITROL) 0.1 % OINT Place 1 application into the right eye 4 (four) times daily. (Patient not taking: Reported on 12/19/2017)  . prednisoLONE acetate (PRED FORTE) 1 % ophthalmic suspension Place 1 drop into the right eye 4 (four) times daily. (Patient not taking: Reported on 01/16/2018)   No current facility-administered medications for this visit.  (Ophthalmic Drugs)   Current Outpatient Medications (Other)  Medication Sig  .  albuterol (PROVENTIL HFA;VENTOLIN HFA) 108 (90 BASE) MCG/ACT inhaler Inhale 2 puffs into the lungs every 6 (six) hours as needed.   . balsalazide (COLAZAL) 750 MG capsule Take 3 capsules (2,250 mg total) by mouth 3 (three) times daily.  . Calcium Carbonate-Vitamin D (CALCIUM 600+D) 600-400 MG-UNIT per tablet Take 1 tablet by mouth 2 (two) times daily.    . DELZICOL 400 MG CPDR DR capsule TAKE 6 CAPSULES TWICE A DAY  . diltiazem 2 % GEL Apply 1 application topically 2 (two) times daily.  . Fluticasone-Salmeterol (ADVAIR) 100-50 MCG/DOSE AEPB Inhale 1 puff into the lungs 2 (two) times daily.    Marland Kitchen loratadine (LORATADINE ALLERGY RELIEF) 10 MG dissolvable tablet Take 10 mg by mouth daily.    . mesalamine (CANASA) 1000 MG suppository Place 1 suppository (1,000 mg total) rectally as needed.  . montelukast (SINGULAIR) 10 MG tablet Take 10 mg by mouth at bedtime.    . Multiple Vitamin (MULTIVITAMIN) tablet Take 1 tablet by mouth daily.    . nebivolol (BYSTOLIC) 2.5 MG tablet Take 2.5 mg by mouth daily.  . phentermine (ADIPEX-P) 37.5 MG tablet Take 37.5 mg by mouth daily.  . sertraline (ZOLOFT) 50 MG tablet Take 1.5 tablets by mouth daily.  . Simethicone (GAS-X EXTRA STRENGTH) 125 MG CAPS Take by mouth as needed.     No current facility-administered medications for this visit.  (Other)      REVIEW OF SYSTEMS: ROS  Positive for: Musculoskeletal, Eyes, Allergic/Imm   Negative for: Constitutional, Gastrointestinal, Neurological, Skin, Genitourinary, HENT, Endocrine, Cardiovascular, Respiratory, Psychiatric, Heme/Lymph   Last edited by Debbrah Alar, COT on 01/16/2018  8:00 AM. (History)       ALLERGIES Allergies  Allergen Reactions  . Cefaclor Rash    PAST MEDICAL HISTORY Past Medical History:  Diagnosis Date  . Allergic rhinitis   . Allergy   . Anemia    iron deficient  . Arthritis   . Asthma    inhaler  . Cataract    left removed, right still there  . Depression   . Eczema    . History of iron deficiency anemia   . Hx of adenomatous polyp of colon 06/05/2012   2013 single adenoma - repeat colonoscopy 2016-17  . Sleep apnea    wears c-pap  . Ulcerative colitis, left sided ([place]) 2003   Past Surgical History:  Procedure Laterality Date  . CARPAL TUNNEL RELEASE  2017   bilateral  . CATARACT EXTRACTION Left 05/2013  . COLONOSCOPY  11/10/2008   ulcerative colitis  . COLONOSCOPY    . EYE SURGERY    . TONSILLECTOMY AND ADENOIDECTOMY  1966    FAMILY HISTORY Family History  Problem Relation Age of Onset  . Colon polyps Father   . Diabetes Father   . Heart disease Mother   . Breast cancer Maternal Grandmother   . Clotting disorder Daughter 24       deceased, blood clot  . Colon cancer Neg Hx   . Esophageal cancer Neg Hx   . Rectal cancer Neg Hx   . Stomach cancer Neg Hx     SOCIAL HISTORY Social History   Tobacco Use  . Smoking status: Never Smoker  . Smokeless tobacco: Never Used  Substance Use Topics  . Alcohol use: Yes    Alcohol/week: 0.6 oz    Types: 1 Standard drinks or equivalent per week    Comment: occ  . Drug use: No         OPHTHALMIC EXAM:  Base Eye Exam    Visual Acuity (Snellen - Linear)      Right Left   Dist [place] 20/60 +2 20/25 +2   Dist ph [place] 20/40 20/20 -1       Tonometry (Tonopen, 8:04 AM)      Right Left   Pressure 17 13       Pupils      Dark Light Shape React APD   Right 4 2 Round Brisk None   Left 4 2 Round Brisk None       Visual Fields (Counting fingers)      Left Right    Full Full       Neuro/Psych    Oriented x3:  Yes   Mood/Affect:  Normal       Dilation    Both eyes:  1.0% Mydriacyl, 2.5% Phenylephrine @ 8:04 AM        Slit Lamp and Fundus Exam    External Exam      Right Left   External Periorbital edema        Slit Lamp Exam      Right Left   Lids/Lashes Dermatochalasis - upper lid, mild Meibomian gland dysfunction Dermatochalasis - upper lid, mild Telangiectasia    Conjunctiva/Sclera [place] clearing ST quadrant White and quiet   Cornea Arcus, 2+ diffuse Punctate epithelial erosions Arcus, 3+ central Punctate epithelial erosions, decreased TBUT   Anterior Chamber  Deep and quiet, open angles  Deep and quiet   Iris Round and moderately dilated to 62m Round and dilated   Lens 2-3+ Nuclear sclerosis, 2+ Cortical cataract - greatest nasally PC IOL in good position, trace Posterior capsular opacification   Vitreous Vitreous syneresis, inferior vitreous debris -- improved, gas bubble gone Vitreous syneresis       Fundus Exam      Right Left   Disc Pink and Sharp Pink and Sharp   C/D Ratio 0.25 0.25   Macula flat, SRF improved, Retinal pigment epithelial mottling, No heme or edema, mild demarkation line IN to fovea Flat, Good foveal reflex, mild Retinal pigment epithelial mottling, No heme or edema   Vessels Normal Mild Copper wiring, mildly Tortuous   Periphery Superior retinal detachment w/ SRF improved, 2 retinal tears at 1030 in line with one another in same meridian, good cryo and  laser changes visible in ST quadrant Attached, lattice degeneration at 1200 almost to ora, lattice at [place] Procedures for _0 @  OCT, Retina - OU - Both Eyes       Right Eye Quality was good. Central Foveal Thickness: 254. Progression has been stable. Findings include normal foveal contour, no IRF, outer retinal atrophy, no SRF (Trace slivers of SRF - stable; persistent central ellipsoid thinning).   Left Eye Quality was good. Central Foveal Thickness: 285. Progression has been stable. Findings include normal foveal contour, no IRF, no SRF (Trace ERM).   Notes *Images captured and stored on drive  Diagnosis / Impression:  OD: retina reattached with trace slivers of SRF - stable; mild subfoveal ellipsoid thinning OS: NFP, No IRF/SRF  Clinical management:  See below  Abbreviations: NFP - Normal foveal profile. CME -  cystoid macular edema. PED - pigment epithelial detachment. IRF - intraretinal fluid. SRF - subretinal fluid. EZ - ellipsoid zone. ERM - epiretinal membrane. ORA - outer retinal atrophy. ORT - outer retinal tubulation. SRHM - subretinal hyper-reflective material         Repair Retinal Breaks, Laser - OS - Left Eye       LASER PROCEDURE NOTE  Procedure:  Barrier laser retinopexy using laser indirect ophthalmoscope, LEFT eye   Diagnosis:   Lattice degeneration with atrophic holes, LEFT eye                     1230 and 0500 o'clock anterior to equator   Surgeon: BBernarda Caffey MD, PhD  Anesthesia: Topical  Informed consent obtained, operative eye marked, and time out performed prior to initiation of laser.   Laser settings:  Lumenis [id number] indirect ophthalmoscope Power: 300 mW Duration: 100 msec  # spots: 505  Placement of laser: Laser was placed in three confluent rows around patches of lattice degeneration with atrophic holes at 12 oclock and 5 oclock.  Complications: None.  Patient tolerated the procedure well and received written and verbal post-procedure care information/education.                  ASSESSMENT/PLAN:    ICD-10-CM   1. 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 Essential hypertension I10   4. Hypertensive retinopathy of both eyes H35.033   5. 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 Pseudophakia of  left eye Z96.1   7. Retinal edema H35.81 OCT, Retina - OU - Both Eyes  8. Retinal hole of left eye H33.322 Repair Retinal Breaks, Laser - OS - Left Eye    1. Macula-involving rhegmatogenous retinal detachment, OD- - RD from 0900 to 0100 with SRF tracking into macula - 2 tears at 1030 meridian  - s/p pneumatic cryopexy OD (6.11.19)  - s/p touch up laser retinopexy OD (06.14.19) - BCVA improved to  20/40 - IOP okay - gas bubble gone - persistent tr slivers of SRF -- stable - stable sub-foveal ellipsoid thinning - cont  Brimonidine OD QD  maxitrol ung OD prn - post op instructions reviewed and new drop sheet given - F/U 3 weeks  2,8. Lattice degeneration w/ atrophic holes, OU - lattice at 1230 ora and 5 oclock - discussed findings, prognosis, and treatment options including observation - recommend laser retinopexy OS today (07.24.19) - RBA of procedure discussed, questions answered - informed consent obtained and signed - see procedure note - F/U 3 weeks   3, 4. Hypertensive retinopathy OU - discussed importance of tight BP control - monitor  5. Combined form age-related cataract OD-  - The symptoms of cataract, surgical options, and treatments and risks were discussed with patient. - discussed diagnosis and progression - not yet visually significant - monitor for now  6. Pseudophakia OS  - s/p CE/IOL OS [facility])  - beautiful surgery, doing well  - monitor    Ophthalmic Meds Ordered this visit:  No orders of the defined types were placed in this encounter.      Return in about 3 weeks (around 02/06/2018) for F/U RD rep OD, laser ret OS, DFE, OCT.  There are no Patient Instructions on file for this visit.   Explained the diagnoses, plan, and follow up with the patient and they expressed understanding.  Patient expressed understanding of the importance of proper follow up care.   This document serves as a record of services personally performed by Gardiner Sleeper, MD, PhD. It was created on their behalf by Catha Brow, [place], a certified ophthalmic assistant. The creation of this record is the provider's dictation and/or activities during the visit.  Electronically signed by: Catha Brow, COA  07.22.19 9:02 AM   Gardiner Sleeper, M.D., Ph.D. Diseases & Surgery of the Retina and Vitreous [facility]  I have reviewed the  above documentation for accuracy and completeness, and I agree with the above. Gardiner Sleeper, M.D., Ph.D. 01/16/18 9:02 AM     Abbreviations: M myopia (nearsighted); A astigmatism; H hyperopia (farsighted); P presbyopia; Mrx spectacle prescription;  CTL contact lenses; OD right eye; OS left eye; OU both eyes  XT exotropia; ET esotropia; PEK punctate epithelial keratitis; PEE punctate epithelial erosions; DES dry eye syndrome; MGD meibomian gland dysfunction; ATs artificial tears; PFAT's preservative free artificial tears; [place] nuclear sclerotic cataract; PSC posterior subcapsular cataract; ERM epi-retinal membrane; PVD posterior vitreous detachment; RD retinal detachment; DM diabetes mellitus; DR diabetic retinopathy; NPDR non-proliferative diabetic retinopathy; PDR proliferative diabetic retinopathy; CSME clinically significant macular edema; DME diabetic macular edema; dbh dot blot hemorrhages; CWS cotton wool spot; POAG primary open angle glaucoma; C/D cup-to-disc ratio; HVF humphrey visual field; GVF goldmann visual field; OCT optical coherence tomography; IOP intraocular pressure; BRVO Branch retinal vein occlusion; CRVO central retinal vein occlusion; CRAO central retinal artery occlusion; BRAO branch retinal artery occlusion; RT retinal tear; SB scleral buckle; PPV pars plana vitrectomy;  VH Vitreous hemorrhage; PRP panretinal laser photocoagulation; IVK intravitreal kenalog; VMT vitreomacular traction; MH Macular hole;  NVD neovascularization of the disc; NVE neovascularization elsewhere; AREDS age related eye disease study; ARMD age related macular degeneration; POAG primary open angle glaucoma; EBMD epithelial/anterior basement membrane dystrophy; ACIOL anterior chamber intraocular lens; IOL intraocular lens; PCIOL posterior chamber intraocular lens; Phaco/IOL phacoemulsification with intraocular lens placement; [place] photorefractive keratectomy; LASIK laser assisted in situ keratomileusis; HTN  hypertension; DM diabetes mellitus; COPD chronic obstructive pulmonary disease

## 2018-01-16 ENCOUNTER — Ambulatory Visit (INDEPENDENT_AMBULATORY_CARE_PROVIDER_SITE_OTHER): Payer: BLUE CROSS/BLUE SHIELD | Admitting: Ophthalmology

## 2018-01-16 ENCOUNTER — Encounter (INDEPENDENT_AMBULATORY_CARE_PROVIDER_SITE_OTHER): Payer: Self-pay | Admitting: Ophthalmology

## 2018-01-16 DIAGNOSIS — I10 Essential (primary) hypertension: Secondary | ICD-10-CM

## 2018-01-16 DIAGNOSIS — H3581 Retinal edema: Secondary | ICD-10-CM | POA: Diagnosis not present

## 2018-01-16 DIAGNOSIS — H35413 Lattice degeneration of retina, bilateral: Secondary | ICD-10-CM | POA: Diagnosis not present

## 2018-01-16 DIAGNOSIS — H35033 Hypertensive retinopathy, bilateral: Secondary | ICD-10-CM

## 2018-01-16 DIAGNOSIS — H3321 Serous retinal detachment, right eye: Secondary | ICD-10-CM | POA: Diagnosis not present

## 2018-01-16 DIAGNOSIS — H33322 Round hole, left eye: Secondary | ICD-10-CM | POA: Diagnosis not present

## 2018-01-16 DIAGNOSIS — H25811 Combined forms of age-related cataract, right eye: Secondary | ICD-10-CM

## 2018-01-16 DIAGNOSIS — Z961 Presence of intraocular lens: Secondary | ICD-10-CM

## 2018-02-05 NOTE — Progress Notes (Signed)
[facility] Note  02/06/2018     CHIEF COMPLAINT Patient presents for Retina Follow Up s/p pneumatic cryopexy OD 12/04/17  HISTORY OF PRESENT ILLNESS: [name] is a 57 y.o. female who presents to the clinic today for:   HPI    Retina Follow Up    Patient presents with  Retinal Break/Detachment.  In right eye.  This started 1 month ago.  Severity is mild.  Since onset it is stable.  I, the attending physician,  performed the HPI with the patient and updated documentation appropriately.          Comments    F/U RD Repair, S/P laser ret. OS. Patient states she has occasional floaters OD, none on the os. Denies new visual issues. Pt is using Brimonidine gtt's.        Last edited by Bernarda Caffey, MD on 02/06/2018  8:26 AM. (History)      Referring physician: Caryl Bis, MD [place], [place] [address]  HISTORICAL INFORMATION:   Selected notes from the MEDICAL RECORD NUMBER Referred by Dr. Zigmund Daniel for Superior RD OD;  LEE- 06.10.19 (JDM) [BCVA OD: 20/LP OD: 20/20]  Ocular Hx- pseudophakia OS (05/2013), cataract OD, superior RD - break within lattice, HTN ret OU, vitreous opacities OU PMH- arthritis, sleep apnea, ulcerative colitis     CURRENT MEDICATIONS: Current Outpatient Medications (Ophthalmic Drugs)  Medication Sig  . prednisoLONE acetate (PRED FORTE) 1 % ophthalmic suspension Place 1 drop into the right eye 4 (four) times daily.  Marland Kitchen LOTEMAX 0.5 % ophthalmic suspension INSTILL 1 DROP INTO LEFT EYE 4 TIMES A DAY  . neomycin-polymyxin-dexameth (MAXITROL) 0.1 % OINT Place 1 application into the right eye 4 (four) times daily. (Patient not taking: Reported on 12/19/2017)   No current facility-administered medications for this visit.  (Ophthalmic Drugs)   Current Outpatient Medications (Other)  Medication Sig  . albuterol (PROVENTIL HFA;VENTOLIN HFA) 108 (90 BASE) MCG/ACT inhaler Inhale 2 puffs into the lungs every 6 (six) hours as  needed.   . balsalazide (COLAZAL) 750 MG capsule Take 3 capsules (2,250 mg total) by mouth 3 (three) times daily.  . Calcium Carbonate-Vitamin D (CALCIUM 600+D) 600-400 MG-UNIT per tablet Take 1 tablet by mouth 2 (two) times daily.    . DELZICOL 400 MG CPDR DR capsule TAKE 6 CAPSULES TWICE A DAY  . diltiazem 2 % GEL Apply 1 application topically 2 (two) times daily.  . Fluticasone-Salmeterol (ADVAIR) 100-50 MCG/DOSE AEPB Inhale 1 puff into the lungs 2 (two) times daily.    Marland Kitchen loratadine (LORATADINE ALLERGY RELIEF) 10 MG dissolvable tablet Take 10 mg by mouth daily.    . mesalamine (CANASA) 1000 MG suppository Place 1 suppository (1,000 mg total) rectally as needed.  . montelukast (SINGULAIR) 10 MG tablet Take 10 mg by mouth at bedtime.    . Multiple Vitamin (MULTIVITAMIN) tablet Take 1 tablet by mouth daily.    . nebivolol (BYSTOLIC) 2.5 MG tablet Take 2.5 mg by mouth daily.  . phentermine (ADIPEX-P) 37.5 MG tablet Take 37.5 mg by mouth daily.  . sertraline (ZOLOFT) 50 MG tablet Take 1.5 tablets by mouth daily.  . Simethicone (GAS-X EXTRA STRENGTH) 125 MG CAPS Take by mouth as needed.     No current facility-administered medications for this visit.  (Other)      REVIEW OF SYSTEMS: ROS    Positive for: Eyes   Negative for: Constitutional, Gastrointestinal, Neurological, Skin, Genitourinary, Musculoskeletal, HENT,  Endocrine, Cardiovascular, Respiratory, Psychiatric, Allergic/Imm, Heme/Lymph   Last edited by Zenovia Jordan, LPN on 10/20/621  7:62 AM. (History)       ALLERGIES Allergies  Allergen Reactions  . Cefaclor Rash    PAST MEDICAL HISTORY Past Medical History:  Diagnosis Date  . Allergic rhinitis   . Allergy   . Anemia    iron deficient  . Arthritis   . Asthma    inhaler  . Cataract    left removed, right still there  . Depression   . Eczema   . History of iron deficiency anemia   . Hx of adenomatous polyp of colon 06/05/2012   2013 single adenoma - repeat  colonoscopy 2016-17  . Sleep apnea    wears c-pap  . Ulcerative colitis, left sided ([place]) 2003   Past Surgical History:  Procedure Laterality Date  . CARPAL TUNNEL RELEASE  2017   bilateral  . CATARACT EXTRACTION Left 05/2013  . COLONOSCOPY  11/10/2008   ulcerative colitis  . COLONOSCOPY    . EYE SURGERY    . TONSILLECTOMY AND ADENOIDECTOMY  1966    FAMILY HISTORY Family History  Problem Relation Age of Onset  . Colon polyps Father   . Diabetes Father   . Heart disease Mother   . Breast cancer Maternal Grandmother   . Clotting disorder Daughter 24       deceased, blood clot  . Colon cancer Neg Hx   . Esophageal cancer Neg Hx   . Rectal cancer Neg Hx   . Stomach cancer Neg Hx     SOCIAL HISTORY Social History   Tobacco Use  . Smoking status: Never Smoker  . Smokeless tobacco: Never Used  Substance Use Topics  . Alcohol use: Yes    Alcohol/week: 1.0 standard drinks    Types: 1 Standard drinks or equivalent per week    Comment: occ  . Drug use: No         OPHTHALMIC EXAM:  Base Eye Exam    Visual Acuity (Snellen - Linear)      Right Left   Dist [place] 20/60 +3 20/25 +3   Dist ph [place] 20/40 20/20       Tonometry (Tonopen, 8:17 AM)      Right Left   Pressure 15 15       Pupils      Dark Light Shape React APD   Right 4 3 Round Brisk None   Left 4 3 Round Brisk None       Visual Fields (Counting fingers)      Left Right    Full Full       Extraocular Movement      Right Left    Full, Ortho Full, Ortho       Neuro/Psych    Oriented x3:  Yes   Mood/Affect:  Normal       Dilation    Both eyes:  1.0% Mydriacyl, 2.5% Phenylephrine @ 8:17 AM       Dilation #2    Both eyes:  1.0% Mydriacyl, 2.5% Phenylephrine @ 8:22 AM        Slit Lamp and Fundus Exam    External Exam      Right Left   External Periorbital edema        Slit Lamp Exam      Right Left   Lids/Lashes Dermatochalasis - upper lid, mild Meibomian gland dysfunction  Dermatochalasis - upper lid, mild Telangiectasia   Conjunctiva/Sclera  [facility] clearing ST quadrant White and quiet   Cornea Arcus, 2+ diffuse Punctate epithelial erosions Arcus, 3+ central Punctate epithelial erosions, decreased TBUT   Anterior Chamber Deep and quiet, open angles  Deep and quiet   Iris Round and moderately dilated to 18m Round and dilated   Lens 2-3+ Nuclear sclerosis, 2+ Cortical cataract - greatest nasally PC IOL in good position, trace Posterior capsular opacification   Vitreous Vitreous syneresis, inferior vitreous debris -- improved, gas bubble gone Vitreous syneresis       Fundus Exam      Right Left   Disc Pink and Sharp Pink and Sharp   C/D Ratio 0.25 0.1   Macula flat, SRF improved, Retinal pigment epithelial mottling, No heme or edema, mild demarkation line IN to fovea Flat, Good foveal reflex, mild Retinal pigment epithelial, mottling, mild Epiretinal membrane, No heme or edema   Vessels Normal Mild Copper wiring, mildly Tortuous, Vascular attenuation   Periphery Superior retinal detachment w/ SRF improved, 2 retinal tears at 1030 in line with one another in same meridian, good cryo and  laser changes visible in ST quadrant Attached, lattice degeneration at 1200 almost to ora, lattice at 0500 -- good laser surrounding both lesions          IMAGING AND PROCEDURES  Imaging and Procedures for _0 @  OCT, Retina - OU - Both Eyes       Right Eye Quality was good. Central Foveal Thickness: 259. Progression has been stable. Findings include normal foveal contour, no IRF, no SRF (Trace slivers of SRF - stable; persistent central ellipsoid thinning).   Left Eye Quality was good. Central Foveal Thickness: 293. Progression has been stable. Findings include normal foveal contour, no IRF, no SRF (Trace ERM).   Notes *Images captured and stored on drive  Diagnosis / Impression:  OD: retina reattached with trace slivers of SRF - stable; mild subfoveal ellipsoid  thinning OS: NFP, No IRF/SRF  Clinical management:  See below  Abbreviations: NFP - Normal foveal profile. CME - cystoid macular edema. PED - pigment epithelial detachment. IRF - intraretinal fluid. SRF - subretinal fluid. EZ - ellipsoid zone. ERM - epiretinal membrane. ORA - outer retinal atrophy. ORT - outer retinal tubulation. SRHM - subretinal hyper-reflective material                   ASSESSMENT/PLAN:    ICD-10-CM   1. 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 Essential hypertension I10   4. Hypertensive retinopathy of both eyes H35.033   5. 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 Pseudophakia of left eye Z96.1   7. Retinal edema H35.81 OCT, Retina - OU - Both Eyes  8. Retinal hole of left eye H33.322     1. Macula-involving rhegmatogenous retinal detachment, OD- - RD from 0900 to 0100 with SRF tracking into macula - 2 tears at 1030 meridian  - s/p pneumatic cryopexy OD (6.11.19)  - s/p touch up laser retinopexy OD (06.14.19) - BCVA improved to 20/40 - IOP okay - gas bubble gone - persistent tr slivers of SRF -- stable/improving - stable sub-foveal ellipsoid thinning - stable off all drops - post op instructions reviewed - F/U 6 weeks for final check  2,8. Lattice degeneration w/ atrophic holes, OU - lattice at 1230 ora and 5 oclock - S/P laser retinopexy OS (07.24.19) -- laser looks good -  F/U 6 weeks for final check -- if okay, will release to [place]  3,4. Hypertensive retinopathy OU - discussed importance of tight BP control - monitor  5. Combined form age-related cataract OD-  - The symptoms of cataract, surgical options, and treatments and risks were discussed with patient. - discussed diagnosis and progression - approaching visual significance  6. Pseudophakia OS  - s/p CE/IOL OS [facility])  - beautiful surgery, doing well  -  monitor  7. No retinal edema on exam or OCT     Ophthalmic Meds Ordered this visit:  No orders of the defined types were placed in this encounter.      Return in about 6 weeks (around 03/20/2018) for F/U RD rep OD.  There are no Patient Instructions on file for this visit.   Explained the diagnoses, plan, and follow up with the patient and they expressed understanding.  Patient expressed understanding of the importance of proper follow up care.   This document serves as a record of services personally performed by Gardiner Sleeper, MD, PhD. It was created on their behalf by Catha Brow, [place], a certified ophthalmic assistant. The creation of this record is the provider's dictation and/or activities during the visit.  Electronically signed by: Catha Brow, COA  08.13.19 9:14 AM    Gardiner Sleeper, M.D., Ph.D. Diseases & Surgery of the Retina and Vitreous [facility]   I have reviewed the above documentation for accuracy and completeness, and I agree with the above. Gardiner Sleeper, M.D., Ph.D. 02/06/18 9:14 AM    Abbreviations: M myopia (nearsighted); A astigmatism; H hyperopia (farsighted); P presbyopia; Mrx spectacle prescription;  CTL contact lenses; OD right eye; OS left eye; OU both eyes  XT exotropia; ET esotropia; PEK punctate epithelial keratitis; PEE punctate epithelial erosions; DES dry eye syndrome; MGD meibomian gland dysfunction; ATs artificial tears; PFAT's preservative free artificial tears; [place] nuclear sclerotic cataract; PSC posterior subcapsular cataract; ERM epi-retinal membrane; PVD posterior vitreous detachment; RD retinal detachment; DM diabetes mellitus; DR diabetic retinopathy; NPDR non-proliferative diabetic retinopathy; PDR proliferative diabetic retinopathy; CSME clinically significant macular edema; DME diabetic macular edema; dbh dot blot hemorrhages; CWS cotton wool spot; POAG primary open angle glaucoma; C/D cup-to-disc  ratio; HVF humphrey visual field; GVF goldmann visual field; OCT optical coherence tomography; IOP intraocular pressure; BRVO Branch retinal vein occlusion; CRVO central retinal vein occlusion; CRAO central retinal artery occlusion; BRAO branch retinal artery occlusion; RT retinal tear; SB scleral buckle; PPV pars plana vitrectomy; VH Vitreous hemorrhage; PRP panretinal laser photocoagulation; IVK intravitreal kenalog; VMT vitreomacular traction; MH Macular hole;  NVD neovascularization of the disc; NVE neovascularization elsewhere; AREDS age related eye disease study; ARMD age related macular degeneration; POAG primary open angle glaucoma; EBMD epithelial/anterior basement membrane dystrophy; ACIOL anterior chamber intraocular lens; IOL intraocular lens; PCIOL posterior chamber intraocular lens; Phaco/IOL phacoemulsification with intraocular lens placement; [place] photorefractive keratectomy; LASIK laser assisted in situ keratomileusis; HTN hypertension; DM diabetes mellitus; COPD chronic obstructive pulmonary disease

## 2018-02-06 ENCOUNTER — Ambulatory Visit (INDEPENDENT_AMBULATORY_CARE_PROVIDER_SITE_OTHER): Payer: BLUE CROSS/BLUE SHIELD | Admitting: Ophthalmology

## 2018-02-06 ENCOUNTER — Encounter (INDEPENDENT_AMBULATORY_CARE_PROVIDER_SITE_OTHER): Payer: Self-pay | Admitting: Ophthalmology

## 2018-02-06 DIAGNOSIS — H35413 Lattice degeneration of retina, bilateral: Secondary | ICD-10-CM

## 2018-02-06 DIAGNOSIS — H3321 Serous retinal detachment, right eye: Secondary | ICD-10-CM | POA: Diagnosis not present

## 2018-02-06 DIAGNOSIS — Z961 Presence of intraocular lens: Secondary | ICD-10-CM

## 2018-02-06 DIAGNOSIS — H25811 Combined forms of age-related cataract, right eye: Secondary | ICD-10-CM

## 2018-02-06 DIAGNOSIS — H35033 Hypertensive retinopathy, bilateral: Secondary | ICD-10-CM

## 2018-02-06 DIAGNOSIS — H33322 Round hole, left eye: Secondary | ICD-10-CM

## 2018-02-06 DIAGNOSIS — I10 Essential (primary) hypertension: Secondary | ICD-10-CM

## 2018-02-06 DIAGNOSIS — H3581 Retinal edema: Secondary | ICD-10-CM | POA: Diagnosis not present

## 2018-03-19 NOTE — Progress Notes (Signed)
[facility] Note  03/20/2018     CHIEF COMPLAINT Patient presents for Retina Follow Up s/p pneumatic cryopexy OD 12/04/17  HISTORY OF PRESENT ILLNESS: [name] is a 57 y.o. female who presents to the clinic today for:   HPI    Retina Follow Up    Patient presents with  Retinal Break/Detachment.  In right eye.  This started 3 months ago.  Severity is moderate.  Duration of 6 weeks.  Since onset it is stable.  I, the attending physician,  performed the HPI with the patient and updated documentation appropriately.          Comments    F/U RD repair OD; S/P pneumatic cryopexy OD (06.11.19), S/P touch up laser retinopexy OD (06.14.19); Pt states OD VA is stable; Pt states she continues to have floaters but states they are "minimal"; Pt states she has been wearing CTL with no issues; Pt denies ocular pain, denies flashes, denies wavy VA;        Last edited by Bernarda Caffey, MD on 03/20/2018  8:43 AM. (History)      Referring physician: Arminda Resides, [place] [address]  HISTORICAL INFORMATION:   Selected notes from the MEDICAL RECORD NUMBER Referred by Dr. Zigmund Daniel for Superior RD OD;  LEE- 06.10.19 (JDM) [BCVA OD: 20/LP OD: 20/20]  Ocular Hx- pseudophakia OS (05/2013), cataract OD, superior RD - break within lattice, HTN ret OU, vitreous opacities OU PMH- arthritis, sleep apnea, ulcerative colitis     CURRENT MEDICATIONS: Current Outpatient Medications (Ophthalmic Drugs)  Medication Sig  . LOTEMAX 0.5 % ophthalmic suspension INSTILL 1 DROP INTO LEFT EYE 4 TIMES A DAY  . neomycin-polymyxin-dexameth (MAXITROL) 0.1 % OINT Place 1 application into the right eye 4 (four) times daily. (Patient not taking: Reported on 12/19/2017)  . prednisoLONE acetate (PRED FORTE) 1 % ophthalmic suspension Place 1 drop into the right eye 4 (four) times daily.   No current facility-administered medications for this visit.  (Ophthalmic  Drugs)   Current Outpatient Medications (Other)  Medication Sig  . albuterol (PROVENTIL HFA;VENTOLIN HFA) 108 (90 BASE) MCG/ACT inhaler Inhale 2 puffs into the lungs every 6 (six) hours as needed.   . balsalazide (COLAZAL) 750 MG capsule Take 3 capsules (2,250 mg total) by mouth 3 (three) times daily.  . Calcium Carbonate-Vitamin D (CALCIUM 600+D) 600-400 MG-UNIT per tablet Take 1 tablet by mouth 2 (two) times daily.    . DELZICOL 400 MG CPDR DR capsule TAKE 6 CAPSULES TWICE A DAY  . diltiazem 2 % GEL Apply 1 application topically 2 (two) times daily.  . Fluticasone-Salmeterol (ADVAIR) 100-50 MCG/DOSE AEPB Inhale 1 puff into the lungs 2 (two) times daily.    Marland Kitchen loratadine (LORATADINE ALLERGY RELIEF) 10 MG dissolvable tablet Take 10 mg by mouth daily.    . mesalamine (CANASA) 1000 MG suppository Place 1 suppository (1,000 mg total) rectally as needed.  . montelukast (SINGULAIR) 10 MG tablet Take 10 mg by mouth at bedtime.    . Multiple Vitamin (MULTIVITAMIN) tablet Take 1 tablet by mouth daily.    . nebivolol (BYSTOLIC) 2.5 MG tablet Take 2.5 mg by mouth daily.  . phentermine (ADIPEX-P) 37.5 MG tablet Take 37.5 mg by mouth daily.  . sertraline (ZOLOFT) 50 MG tablet Take 1.5 tablets by mouth daily.  . Simethicone (GAS-X EXTRA STRENGTH) 125 MG CAPS Take by mouth as needed.     No current facility-administered medications for  this visit.  (Other)      REVIEW OF SYSTEMS: ROS    Positive for: Gastrointestinal, Skin, Musculoskeletal, Eyes, Respiratory   Negative for: Constitutional, Neurological, Genitourinary, HENT, Endocrine, Cardiovascular, Psychiatric, Allergic/Imm, Heme/Lymph   Last edited by Cherrie Gauze, COA on 03/20/2018  8:07 AM. (History)       ALLERGIES Allergies  Allergen Reactions  . Cefaclor Rash    PAST MEDICAL HISTORY Past Medical History:  Diagnosis Date  . Allergic rhinitis   . Allergy   . Anemia    iron deficient  . Arthritis   . Asthma    inhaler  .  Cataract    left removed, right still there  . Depression   . Eczema   . History of iron deficiency anemia   . Hx of adenomatous polyp of colon 06/05/2012   2013 single adenoma - repeat colonoscopy 2016-17  . Sleep apnea    wears c-pap  . Ulcerative colitis, left sided ([place]) 2003   Past Surgical History:  Procedure Laterality Date  . CARPAL TUNNEL RELEASE  2017   bilateral  . CATARACT EXTRACTION Left 05/2013  . COLONOSCOPY  11/10/2008   ulcerative colitis  . COLONOSCOPY    . EYE SURGERY    . TONSILLECTOMY AND ADENOIDECTOMY  1966    FAMILY HISTORY Family History  Problem Relation Age of Onset  . Colon polyps Father   . Diabetes Father   . Heart disease Mother   . Breast cancer Maternal Grandmother   . Clotting disorder Daughter 24       deceased, blood clot  . Colon cancer Neg Hx   . Esophageal cancer Neg Hx   . Rectal cancer Neg Hx   . Stomach cancer Neg Hx     SOCIAL HISTORY Social History   Tobacco Use  . Smoking status: Never Smoker  . Smokeless tobacco: Never Used  Substance Use Topics  . Alcohol use: Yes    Alcohol/week: 1.0 standard drinks    Types: 1 Standard drinks or equivalent per week    Comment: occ  . Drug use: No         OPHTHALMIC EXAM:  Base Eye Exam    Visual Acuity (Snellen - Linear)      Right Left   Dist [place] 20/40 20/20   Dist ph [place] 20/25 20/20       Tonometry (Tonopen, 8:16 AM)      Right Left   Pressure 17 18       Pupils      Dark Light Shape React APD   Right 5 3 Round Brisk None   Left 5 3 Round Brisk None       Visual Fields (Counting fingers)      Left Right    Full Full       Extraocular Movement      Right Left    Full, Ortho Full, Ortho       Neuro/Psych    Oriented x3:  Yes   Mood/Affect:  Normal       Dilation    Both eyes:  1.0% Mydriacyl, 2.5% Phenylephrine @ 8:17 AM        Slit Lamp and Fundus Exam    External Exam      Right Left   External Periorbital edema        Slit Lamp Exam       Right Left   Lids/Lashes Dermatochalasis - upper lid, mild Meibomian gland dysfunction Dermatochalasis -  upper lid, mild Telangiectasia   Conjunctiva/Sclera White and quiet White and quiet   Cornea Arcus, 2+ diffuse Punctate epithelial erosions Arcus, 2-3+ central Punctate epithelial erosions, decreased TBUT   Anterior Chamber Deep and quiet, open angles  Deep and quiet   Iris Round and dilated Round and dilated   Lens 2+ Nuclear sclerosis, 2+ Cortical cataract - greatest nasally PC IOL in good position, 1+ Posterior capsular opacification superior and inferiorly outside visual axis   Vitreous Vitreous syneresis, pigment in anterior vitreous Vitreous syneresis       Fundus Exam      Right Left   Disc Pink and Sharp Pink and Sharp   C/D Ratio 0.2 0.2   Macula Good foveal reflex, flat, SRF improved, Retinal pigment epithelial mottling, mild demarkation line IN to fovea -- improved, No heme or edema Flat, Good foveal reflex, mild Retinal pigment epithelial, mottling, mild Epiretinal membrane, No heme or edema   Vessels Normal Mild Copper wiring, mildly Tortuous, Vascular attenuation   Periphery Superior retinal detachment w/ SRF improved, 2 retinal tears at 1030 in line with one another in same meridian, good cryo and  laser changes visible in ST quadrant Attached, lattice degeneration at 1200 almost to ora, lattice at 0500 -- good laser surrounding both lesions          IMAGING AND PROCEDURES  Imaging and Procedures for _0 @  OCT, Retina - OU - Both Eyes       Right Eye Quality was good. Central Foveal Thickness: 264. Progression has improved. Findings include normal foveal contour, no IRF, no SRF (Trace slivers of SRF - improved; mild improvement central ellipsoid thinning).   Left Eye Quality was good. Central Foveal Thickness: 295. Progression has been stable. Findings include normal foveal contour, no IRF, no SRF (Trace ERM).   Notes *Images captured and stored on  drive  Diagnosis / Impression:  OD: retina reattached with improved slivers of SRF; mild interval improvement in subfoveal ellipsoid thinning OS: NFP, No IRF/SRF  Clinical management:  See below  Abbreviations: NFP - Normal foveal profile. CME - cystoid macular edema. PED - pigment epithelial detachment. IRF - intraretinal fluid. SRF - subretinal fluid. EZ - ellipsoid zone. ERM - epiretinal membrane. ORA - outer retinal atrophy. ORT - outer retinal tubulation. SRHM - subretinal hyper-reflective material                   ASSESSMENT/PLAN:    ICD-10-CM   1. 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 Essential hypertension I10   4. Hypertensive retinopathy of both eyes H35.033   5. 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 Pseudophakia of left eye Z96.1   7. Retinal edema H35.81 OCT, Retina - OU - Both Eyes  8. Retinal hole of left eye H33.322     1. Macula-involving rhegmatogenous retinal detachment, OD- - RD from 0900 to 0100 with SRF tracking into macula - 2 tears at 1030 meridian  - s/p pneumatic cryopexy OD (6.11.19)  - s/p touch up laser retinopexy OD (06.14.19) - BCVA improved to 20/25 - IOP okay - gas bubble gone - persistent tr slivers of SRF -- improving -- almost resolved - improvement in sub-foveal ellipsoid thinning - stable off all drops - cleared to resume routine eye care with Dr. Dianne Dun - F/U 9-12 months, sooner prn  2,8. Lattice degeneration w/ atrophic holes, OU -  lattice at 1230 and 5 oclock ora OS - S/P laser retinopexy OS (07.24.19) -- laser looks good - Reviewed s/s of RT/RD - Strict return precautions for any such RT/RD signs/symptoms   3,4. Hypertensive retinopathy OU - discussed importance of tight BP control - monitor  5. Combined form age-related cataract OD-  - The symptoms of cataract, surgical options, and treatments and risks  were discussed with patient. - discussed diagnosis and progression - approaching visual significance - management per Dr. Waynard Edwards - now clear from a retina standpoint to proceed with cataract surgery when pt and surgeon are ready  6. Pseudophakia OS  - s/p CE/IOL OS [facility])  - beautiful surgery, doing well  - monitor  7. No retinal edema on exam or OCT     Ophthalmic Meds Ordered this visit:  No orders of the defined types were placed in this encounter.      Return in about 1 year (around 03/21/2019) for F/U RD repair OD, DFE, OCT.  There are no Patient Instructions on file for this visit.   Explained the diagnoses, plan, and follow up with the patient and they expressed understanding.  Patient expressed understanding of the importance of proper follow up care.   This document serves as a record of services personally performed by Gardiner Sleeper, MD, PhD. It was created on their behalf by Ernest Mallick, OA, an ophthalmic assistant. The creation of this record is the provider's dictation and/or activities during the visit.    Electronically signed by: Ernest Mallick, OA  09.24.19 8:58 AM   This document serves as a record of services personally performed by Gardiner Sleeper, MD, PhD. It was created on their behalf by Catha Brow, [place], a certified ophthalmic assistant. The creation of this record is the provider's dictation and/or activities during the visit.  Electronically signed by: Catha Brow, COA  09.25.19 8:58 AM   Gardiner Sleeper, M.D., Ph.D. Diseases & Surgery of the Retina and Vitreous [facility]   I have reviewed the above documentation for accuracy and completeness, and I agree with the above. Gardiner Sleeper, M.D., Ph.D. 03/20/18 12:01 PM    Abbreviations: M myopia (nearsighted); A astigmatism; H hyperopia (farsighted); P presbyopia; Mrx spectacle prescription;  CTL contact lenses; OD right eye; OS left eye; OU both  eyes  XT exotropia; ET esotropia; PEK punctate epithelial keratitis; PEE punctate epithelial erosions; DES dry eye syndrome; MGD meibomian gland dysfunction; ATs artificial tears; PFAT's preservative free artificial tears; [place] nuclear sclerotic cataract; PSC posterior subcapsular cataract; ERM epi-retinal membrane; PVD posterior vitreous detachment; RD retinal detachment; DM diabetes mellitus; DR diabetic retinopathy; NPDR non-proliferative diabetic retinopathy; PDR proliferative diabetic retinopathy; CSME clinically significant macular edema; DME diabetic macular edema; dbh dot blot hemorrhages; CWS cotton wool spot; POAG primary open angle glaucoma; C/D cup-to-disc ratio; HVF humphrey visual field; GVF goldmann visual field; OCT optical coherence tomography; IOP intraocular pressure; BRVO Branch retinal vein occlusion; CRVO central retinal vein occlusion; CRAO central retinal artery occlusion; BRAO branch retinal artery occlusion; RT retinal tear; SB scleral buckle; PPV pars plana vitrectomy; VH Vitreous hemorrhage; PRP panretinal laser photocoagulation; IVK intravitreal kenalog; VMT vitreomacular traction; MH Macular hole;  NVD neovascularization of the disc; NVE neovascularization elsewhere; AREDS age related eye disease study; ARMD age related macular degeneration; POAG primary open angle glaucoma; EBMD epithelial/anterior basement membrane dystrophy; ACIOL anterior chamber intraocular lens; IOL intraocular lens; PCIOL posterior chamber intraocular lens; Phaco/IOL phacoemulsification with intraocular lens  placement; [place] photorefractive keratectomy; LASIK laser assisted in situ keratomileusis; HTN hypertension; DM diabetes mellitus; COPD chronic obstructive pulmonary disease

## 2018-03-20 ENCOUNTER — Ambulatory Visit (INDEPENDENT_AMBULATORY_CARE_PROVIDER_SITE_OTHER): Payer: BLUE CROSS/BLUE SHIELD | Admitting: Ophthalmology

## 2018-03-20 ENCOUNTER — Encounter (INDEPENDENT_AMBULATORY_CARE_PROVIDER_SITE_OTHER): Payer: Self-pay | Admitting: Ophthalmology

## 2018-03-20 DIAGNOSIS — I10 Essential (primary) hypertension: Secondary | ICD-10-CM | POA: Diagnosis not present

## 2018-03-20 DIAGNOSIS — H25811 Combined forms of age-related cataract, right eye: Secondary | ICD-10-CM

## 2018-03-20 DIAGNOSIS — H35413 Lattice degeneration of retina, bilateral: Secondary | ICD-10-CM | POA: Diagnosis not present

## 2018-03-20 DIAGNOSIS — H3581 Retinal edema: Secondary | ICD-10-CM | POA: Diagnosis not present

## 2018-03-20 DIAGNOSIS — H35033 Hypertensive retinopathy, bilateral: Secondary | ICD-10-CM

## 2018-03-20 DIAGNOSIS — Z961 Presence of intraocular lens: Secondary | ICD-10-CM

## 2018-03-20 DIAGNOSIS — H3321 Serous retinal detachment, right eye: Secondary | ICD-10-CM

## 2018-03-20 DIAGNOSIS — H33322 Round hole, left eye: Secondary | ICD-10-CM

## 2018-03-29 ENCOUNTER — Encounter: Payer: Self-pay | Admitting: Internal Medicine

## 2018-04-17 ENCOUNTER — Encounter: Payer: Self-pay | Admitting: Internal Medicine

## 2018-04-17 ENCOUNTER — Ambulatory Visit (AMBULATORY_SURGERY_CENTER): Payer: Self-pay | Admitting: *Deleted

## 2018-04-17 VITALS — Ht 65.0 in | Wt 207.0 lb

## 2018-04-17 DIAGNOSIS — K5150 Left sided colitis without complications: Secondary | ICD-10-CM

## 2018-04-17 NOTE — Progress Notes (Signed)
Patient denies any allergies to eggs or soy. Patient denies any problems with anesthesia/sedation. Patient denies any oxygen use at home. Patient denies taking any diet/weight loss medications or blood thinners. Patient states she is NOT taking Phentermine at this time and she is aware not to start this before colon exam. EMMI education offered, pt declined.

## 2018-04-25 ENCOUNTER — Encounter: Payer: Self-pay | Admitting: Internal Medicine

## 2018-04-25 ENCOUNTER — Ambulatory Visit (AMBULATORY_SURGERY_CENTER): Payer: BLUE CROSS/BLUE SHIELD | Admitting: Internal Medicine

## 2018-04-25 VITALS — BP 132/86 | HR 70 | Temp 98.6°F | Resp 10 | Ht 65.0 in | Wt 207.0 lb

## 2018-04-25 DIAGNOSIS — K5150 Left sided colitis without complications: Secondary | ICD-10-CM

## 2018-04-25 DIAGNOSIS — K5282 Eosinophilic colitis: Secondary | ICD-10-CM | POA: Diagnosis not present

## 2018-04-25 MED ORDER — SODIUM CHLORIDE 0.9 % IV SOLN: 500.0000 mL | Freq: Once | INTRAVENOUS | Status: DC

## 2018-04-25 NOTE — Progress Notes (Signed)
Called to room to assist during endoscopic procedure.  Patient ID and intended procedure confirmed with present staff. Received instructions for my participation in the procedure from the performing physician.  

## 2018-04-25 NOTE — Patient Instructions (Addendum)
   Things look really good - I took biopsies and will let you know. Should be 3 years before we repeat.  I appreciate the opportunity to care for you. Gatha Mayer, MD, FACG    YOU HAD AN ENDOSCOPIC PROCEDURE TODAY AT [facility]:   Refer to the procedure report that was given to you for any specific questions about what was found during the examination.  If the procedure report does not answer your questions, please call your gastroenterologist to clarify.  If you requested that your care partner not be given the details of your procedure findings, then the procedure report has been included in a sealed envelope for you to review at your convenience later.  YOU SHOULD EXPECT: Some feelings of bloating in the abdomen. Passage of more gas than usual.  Walking can help get rid of the air that was put into your GI tract during the procedure and reduce the bloating. If you had a lower endoscopy (such as a colonoscopy or flexible sigmoidoscopy) you may notice spotting of blood in your stool or on the toilet paper. If you underwent a bowel prep for your procedure, you may not have a normal bowel movement for a few days.  Please Note:  You might notice some irritation and congestion in your nose or some drainage.  This is from the oxygen used during your procedure.  There is no need for concern and it should clear up in a day or so.  SYMPTOMS TO REPORT IMMEDIATELY:   Following lower endoscopy (colonoscopy or flexible sigmoidoscopy):  Excessive amounts of blood in the stool  Significant tenderness or worsening of abdominal pains  Swelling of the abdomen that is new, acute  Fever of 100F or higher    For urgent or emergent issues, a gastroenterologist can be reached at any hour by calling [phone].   DIET:  We do recommend a small meal at first, but then you may proceed to your regular diet.  Drink plenty of fluids but you should avoid alcoholic beverages for 24  hours.  ACTIVITY:  You should plan to take it easy for the rest of today and you should NOT DRIVE or use heavy machinery until tomorrow (because of the sedation medicines used during the test).    FOLLOW UP: Our staff will call the number listed on your records the next business day following your procedure to check on you and address any questions or concerns that you may have regarding the information given to you following your procedure. If we do not reach you, we will leave a message.  However, if you are feeling well and you are not experiencing any problems, there is no need to return our call.  We will assume that you have returned to your regular daily activities without incident.  If any biopsies were taken you will be contacted by phone or by letter within the next 1-3 weeks.  Please call us at [phone] if you have not heard about the biopsies in 3 weeks.    SIGNATURES/CONFIDENTIALITY: You and/or your care partner have signed paperwork which will be entered into your electronic medical record.  These signatures attest to the fact that that the information above on your After Visit Summary has been reviewed and is understood.  Full responsibility of the confidentiality of this discharge information lies with you and/or your care-partner.

## 2018-04-25 NOTE — Progress Notes (Signed)
PT taken to PACU. Monitors in place. VSS. Report given to RN. 

## 2018-04-25 NOTE — Op Note (Signed)
[facility] Patient Name: [name] Procedure Date: 04/25/2018 9:14 AM MRN: [id number] Endoscopist: Gatha Mayer , MD Age: 57 Referring MD:  Date of Birth: [birth year] Gender: Female Account #: [id number] Procedure:                Colonoscopy Indications:              High risk colon cancer surveillance: Ulcerative                            left sided colitis Medicines:                Propofol per Anesthesia, Monitored Anesthesia Care Procedure:                Pre-Anesthesia Assessment:                           - Prior to the procedure, a History and Physical                            was performed, and patient medications and                            allergies were reviewed. The patient's tolerance of                            previous anesthesia was also reviewed. The risks                            and benefits of the procedure and the sedation                            options and risks were discussed with the patient.                            All questions were answered, and informed consent                            was obtained. Prior Anticoagulants: The patient has                            taken no previous anticoagulant or antiplatelet                            agents. ASA Grade Assessment: III - A patient with                            severe systemic disease. After reviewing the risks                            and benefits, the patient was deemed in                            satisfactory condition to undergo the procedure.  After obtaining informed consent, the colonoscope                            was passed under direct vision. Throughout the                            procedure, the patient's blood pressure, pulse, and                            oxygen saturations were monitored continuously. The                            Colonoscope was introduced through the anus and                            advanced to the  the cecum, identified by                            appendiceal orifice and ileocecal valve. The                            patient tolerated the procedure well. The quality                            of the bowel preparation was adequate. The                            ileocecal valve, appendiceal orifice, and rectum                            were photographed. The bowel preparation used was                            Miralax. The colonoscopy was somewhat difficult due                            to a redundant colon. Successful completion of the                            procedure was aided by applying abdominal pressure. Scope In: 9:15:46 AM Scope Out: 9:36:52 AM Scope Withdrawal Time: 0 hours 16 minutes 59 seconds  Total Procedure Duration: 0 hours 21 minutes 6 seconds  Findings:                 The perianal and digital rectal examinations were                            normal.                           Normal mucosa was found in the descending colon, at                            the splenic flexure, in the transverse colon, at  the hepatic flexure, in the ascending colon and in                            the cecum. Biopsies for histology were taken with a                            cold forceps from the cecum, ascending colon,                            transverse colon and descending colon for                            evaluation of microscopic colitis. Verification of                            patient identification for the specimen was done.                            Estimated blood loss was minimal.                           The mucosa vascular pattern in the rectum and in                            the sigmoid colon was somewhat decreased. Biopsies                            were taken with a cold forceps for histology.                            Verification of patient identification for the                            specimen was done. Estimated  blood loss was minimal.                           The exam was otherwise without abnormality on                            direct and retroflexion views. Complications:            No immediate complications. Estimated Blood Loss:     Estimated blood loss was minimal. Impression:               - Normal mucosa in the descending colon, at the                            splenic flexure, in the transverse colon, at the                            hepatic flexure, in the ascending colon and in the                            cecum. Biopsied.                           -  Decreased mucosa vascular pattern in the rectum                            and in the sigmoid colon. Biopsied.                           - The examination was otherwise normal on direct                            and retroflexion views. NO ACTIVE INFLAMMATION SEEN                            - COLON IS SOMEWHAT REDUNDANT Recommendation:           - Patient has a contact number available for                            emergencies. The signs and symptoms of potential                            delayed complications were discussed with the                            patient. Return to normal activities tomorrow.                            Written discharge instructions were provided to the                            patient.                           - Resume previous diet.                           - Continue present medications.                           - Repeat colonoscopy is recommended for                            surveillance. The colonoscopy date will be                            determined after pathology results from today's                            exam become available for review. Gatha Mayer, MD 04/25/2018 9:50:09 AM This report has been signed electronically.

## 2018-04-26 ENCOUNTER — Telehealth: Payer: Self-pay | Admitting: *Deleted

## 2018-04-26 NOTE — Telephone Encounter (Signed)
Unable to reach pt. On second call.  She answers her phone but does not hear call coming in.

## 2018-04-26 NOTE — Telephone Encounter (Signed)
Pt called states she is doing fine

## 2018-04-26 NOTE — Telephone Encounter (Signed)
No answer, left message to call if questions or concerns. 

## 2018-05-06 ENCOUNTER — Other Ambulatory Visit: Payer: Self-pay | Admitting: Internal Medicine

## 2018-05-15 NOTE — Progress Notes (Signed)
I spoke to her about the biopsies The fragment of low grade dysplasia was in the normal colon - so probably not related to ulcerative colitis Could have been a minute polyp that was not apparent - probably so  In abundance of caution will plan to repeat colonoscopy May 2020.  Place a recall for March - so that the letter will get to her in time. She will also try to keep track.  No letter needed

## 2018-10-21 ENCOUNTER — Encounter: Payer: Self-pay | Admitting: Internal Medicine

## 2018-10-22 ENCOUNTER — Telehealth: Payer: Self-pay | Admitting: *Deleted

## 2018-10-22 ENCOUNTER — Encounter: Payer: Self-pay | Admitting: *Deleted

## 2018-10-22 NOTE — Telephone Encounter (Signed)
Called patient to offer appointment for colonoscopy. Pt prefers a morning appointment and available morning appointments that can be guaranteed not to change are not available at this time. Pt requests we call back when schedule solidified.

## 2018-10-22 NOTE — Telephone Encounter (Signed)
Erroneous encounter

## 2018-11-05 ENCOUNTER — Ambulatory Visit: Payer: BLUE CROSS/BLUE SHIELD | Admitting: *Deleted

## 2018-11-05 ENCOUNTER — Encounter: Payer: Self-pay | Admitting: Internal Medicine

## 2018-11-05 ENCOUNTER — Other Ambulatory Visit: Payer: Self-pay

## 2018-11-05 VITALS — Ht 65.0 in | Wt 205.0 lb

## 2018-11-05 DIAGNOSIS — K5150 Left sided colitis without complications: Secondary | ICD-10-CM

## 2018-11-05 NOTE — Progress Notes (Signed)
No egg or soy allergy known to patient   issues with past sedation with any surgeries  or procedures of PONV , no intubation problems  No diet pills per patient currently- has been off phentermine x 1 month now  No home 02 use per patient  No blood thinners per patient  Pt denies issues with constipation  No A fib or A flutter  EMMI video sent to pt's e mail   Pt mailed instruction packet to included paper to complete and mail back to [facility] with addressed and stamped envelope, Emmi video, copy of consent form to read and not return, and instructions. PV completed over the phone. Pt encouraged to call with questions or issues   Pt states that she felt like last colon she wasn't cleaned out- will do a 2 day miralax prep  Sent instructions via MyChart and mail

## 2018-11-11 ENCOUNTER — Telehealth: Payer: Self-pay | Admitting: *Deleted

## 2018-11-11 NOTE — Telephone Encounter (Signed)
Covid-19 travel screening questions  Have you traveled in the last 14 days?no If yes where?  Do you now or have you had a fever in the last 14 days?no  Do you have any respiratory symptoms of shortness of breath or cough now or in the last 14 days?no  Do you have a medical history of Congestive Heart Failure?  Do you have a medical history of lung disease?  Do you have any family members or close contacts with diagnosed or suspected Covid-19?no  PT aware of care partner policy and will bring a mask with her. SM

## 2018-11-13 ENCOUNTER — Ambulatory Visit (AMBULATORY_SURGERY_CENTER): Payer: BLUE CROSS/BLUE SHIELD | Admitting: Internal Medicine

## 2018-11-13 ENCOUNTER — Other Ambulatory Visit: Payer: Self-pay

## 2018-11-13 ENCOUNTER — Encounter: Payer: Self-pay | Admitting: Internal Medicine

## 2018-11-13 VITALS — BP 143/95 | HR 63 | Temp 98.7°F | Resp 12 | Ht 65.0 in | Wt 205.0 lb

## 2018-11-13 DIAGNOSIS — K529 Noninfective gastroenteritis and colitis, unspecified: Secondary | ICD-10-CM

## 2018-11-13 DIAGNOSIS — K5150 Left sided colitis without complications: Secondary | ICD-10-CM | POA: Diagnosis present

## 2018-11-13 MED ORDER — SODIUM CHLORIDE 0.9 % IV SOLN: 500.0000 mL | Freq: Once | INTRAVENOUS | Status: DC

## 2018-11-13 NOTE — Patient Instructions (Addendum)
There was slight inflammation in the sigmoid and rectum. I did not see anything suspicious for something that would lead to cancer and no polyps/tumors.  Once I get the pathology will let you know.  I appreciate the opportunity to care for you. Gatha Mayer, MD, FACG YOU HAD AN ENDOSCOPIC PROCEDURE TODAY AT [facility]:   Refer to the procedure report that was given to you for any specific questions about what was found during the examination.  If the procedure report does not answer your questions, please call your gastroenterologist to clarify.  If you requested that your care partner not be given the details of your procedure findings, then the procedure report has been included in a sealed envelope for you to review at your convenience later.  YOU SHOULD EXPECT: Some feelings of bloating in the abdomen. Passage of more gas than usual.  Walking can help get rid of the air that was put into your GI tract during the procedure and reduce the bloating. If you had a lower endoscopy (such as a colonoscopy or flexible sigmoidoscopy) you may notice spotting of blood in your stool or on the toilet paper. If you underwent a bowel prep for your procedure, you may not have a normal bowel movement for a few days.  Please Note:  You might notice some irritation and congestion in your nose or some drainage.  This is from the oxygen used during your procedure.  There is no need for concern and it should clear up in a day or so.  SYMPTOMS TO REPORT IMMEDIATELY:   Following lower endoscopy (colonoscopy or flexible sigmoidoscopy):  Excessive amounts of blood in the stool  Significant tenderness or worsening of abdominal pains  Swelling of the abdomen that is new, acute  Fever of 100F or higher   For urgent or emergent issues, a gastroenterologist can be reached at any hour by calling [phone].   DIET:  We do recommend a small meal at first, but then you may proceed to your regular  diet.  Drink plenty of fluids but you should avoid alcoholic beverages for 24 hours.  ACTIVITY:  You should plan to take it easy for the rest of today and you should NOT DRIVE or use heavy machinery until tomorrow (because of the sedation medicines used during the test).    FOLLOW UP: Our staff will call the number listed on your records 48-72 hours following your procedure to check on you and address any questions or concerns that you may have regarding the information given to you following your procedure. If we do not reach you, we will leave a message.  We will attempt to reach you two times.  During this call, we will ask if you have developed any symptoms of COVID 19. If you develop any symptoms (for example fever, flu-like symptoms, shortness of breath, cough etc.) before then, please call [phone].  If any biopsies were taken you will be contacted by phone or by letter within the next 1-3 weeks.  Please call us at [phone] if you have not heard about the biopsies in 3 weeks.   Await for biopsy results  SIGNATURES/CONFIDENTIALITY: You and/or your care partner have signed paperwork which will be entered into your electronic medical record.  These signatures attest to the fact that that the information above on your After Visit Summary has been reviewed and is understood.  Full responsibility of the confidentiality of this discharge information lies with you  and/or your care-partner.

## 2018-11-13 NOTE — Progress Notes (Signed)
Called to room to assist during endoscopic procedure.  Patient ID and intended procedure confirmed with present staff. Received instructions for my participation in the procedure from the performing physician.  

## 2018-11-13 NOTE — Progress Notes (Signed)
Courtney California did temp and Rica Mote did vitals.

## 2018-11-13 NOTE — Progress Notes (Signed)
Pt has spotting of blood in toilet and passing small  Blood clot. Dr. Carlean Purl notified and okay for pt to be discharge. Spotting coming from biopsy.

## 2018-11-13 NOTE — Progress Notes (Signed)
To PACU, VSS. Report to [email or web address] 

## 2018-11-13 NOTE — Progress Notes (Signed)
Pt's states no medical or surgical changes since previsit or office visit. 

## 2018-11-13 NOTE — Op Note (Signed)
[place] Patient Name: [name] Procedure Date: 11/13/2018 7:17 AM MRN: [id number] Endoscopist: Gatha Mayer , MD Age: 58 Referring MD:  Date of Birth: 02-26-1961 Gender: Female Account #: [id number] Procedure:                Colonoscopy Indications:              High risk colon cancer surveillance: Ulcerative                            colitis with known dysplasia (invisible dysplastic                            lesion found on previous random biopsy) low grade                            dysplasia in 1 fragment Medicines:                Propofol per Anesthesia, Monitored Anesthesia Care Procedure:                Pre-Anesthesia Assessment:                           - Prior to the procedure, a History and Physical                            was performed, and patient medications and                            allergies were reviewed. The patient's tolerance of                            previous anesthesia was also reviewed. The risks                            and benefits of the procedure and the sedation                            options and risks were discussed with the patient.                            All questions were answered, and informed consent                            was obtained. Prior Anticoagulants: The patient has                            taken no previous anticoagulant or antiplatelet                            agents. ASA Grade Assessment: II - A patient with                            mild systemic disease. After reviewing the risks  and benefits, the patient was deemed in                            satisfactory condition to undergo the procedure.                           After obtaining informed consent, the colonoscope                            was passed under direct vision. Throughout the                            procedure, the patient's blood pressure, pulse, and                            oxygen  saturations were monitored continuously. The                            Colonoscope was introduced through the anus and                            advanced to the the cecum, identified by                            appendiceal orifice and ileocecal valve. The                            colonoscopy was somewhat difficult due to                            significant looping. Successful completion of the                            procedure was aided by using manual pressure. The                            patient tolerated the procedure well. The quality                            of the bowel preparation was good. The ileocecal                            valve, appendiceal orifice, and rectum were                            photographed. Scope In: 7:45:35 AM Scope Out: 8:11:37 AM Scope Withdrawal Time: 0 hours 14 minutes 23 seconds  Total Procedure Duration: 0 hours 26 minutes 2 seconds  Findings:                 The perianal and digital rectal examinations were                            normal.  A patchy area of mildly erythematous, granular and                            inflamed mucosa was found in the rectum and in the                            distal sigmoid colon. Biopsies were taken with a                            cold forceps for histology. Verification of patient                            identification for the specimen was done. Estimated                            blood loss was minimal.                           The exam was otherwise without abnormality on                            direct and retroflexion views.                           Two biopsies were taken every 10 cm with a cold                            forceps for ulcerative colitis surveillance. These                            biopsy specimens were sent to Pathology.                            Verification of patient identification for the                            specimen was  done. Estimated blood loss was minimal. Complications:            No immediate complications. Estimated Blood Loss:     Estimated blood loss was minimal. Impression:               - Erythematous, granular and inflamed mucosa in the                            rectum and in the distal sigmoid colon. Biopsied.                            Minimal slight patchy inflammation                           - The examination was otherwise normal on direct                            and retroflexion views.                           -  Biopsies for surveillance were taken.                            Cecum/ascending;                            transverse;descending/sigmoid;rectum Recommendation:           - Patient has a contact number available for                            emergencies. The signs and symptoms of potential                            delayed complications were discussed with the                            patient. Return to normal activities tomorrow.                            Written discharge instructions were provided to the                            patient.                           - Resume previous diet.                           - Continue present medications.                           - Repeat colonoscopy is recommended for                            surveillance. The colonoscopy date will be                            determined after pathology results from today's                            exam become available for review. Gatha Mayer, MD 11/13/2018 8:20:11 AM This report has been signed electronically.

## 2018-11-19 ENCOUNTER — Telehealth: Payer: Self-pay

## 2018-11-19 ENCOUNTER — Encounter: Payer: Self-pay | Admitting: Internal Medicine

## 2018-11-19 NOTE — Progress Notes (Signed)
No dysplasia Slight inflammation rectum and sigmoid - would add mesalamine suppository back Letter to patient by My Chart Recall colonoscopy 1 year

## 2018-11-19 NOTE — Telephone Encounter (Signed)
  Follow up Call-  Call back number 11/13/2018 04/25/2018  Post procedure Call Back phone  # [phone]  Permission to leave phone message Yes Yes  Some recent data might be hidden     Patient questions:  Do you have a fever, pain , or abdominal swelling? No. Pain Score  0 *  Have you tolerated food without any problems? Yes.    Have you been able to return to your normal activities? Yes.    Do you have any questions about your discharge instructions: Diet   No. Medications  No. Follow up visit  No.  Do you have questions or concerns about your Care? No.  Actions: * If pain score is 4 or above: No action needed, pain <4. 1. Have you developed a fever since your procedure? no  2.   Have you had an respiratory symptoms (SOB or cough) since your procedure? no  3.   Have you tested positive for COVID 19 since your procedure no  4.   Have you had any family members/close contacts diagnosed with the COVID 19 since your procedure?  no   If any of these questions are a yes, please inquire if patient has been seen by family doctor and route this note to Joylene John, Therapist, sports.

## 2018-11-20 ENCOUNTER — Other Ambulatory Visit: Payer: Self-pay | Admitting: Internal Medicine

## 2018-11-20 MED ORDER — MESALAMINE 1000 MG RE SUPP: 1000.0000 mg | Freq: Every day | RECTAL | 3 refills | Status: DC

## 2018-12-04 ENCOUNTER — Other Ambulatory Visit: Payer: Self-pay | Admitting: Internal Medicine

## 2019-06-15 ENCOUNTER — Encounter (INDEPENDENT_AMBULATORY_CARE_PROVIDER_SITE_OTHER): Payer: Self-pay | Admitting: Ophthalmology

## 2019-06-16 ENCOUNTER — Encounter (INDEPENDENT_AMBULATORY_CARE_PROVIDER_SITE_OTHER): Payer: BLUE CROSS/BLUE SHIELD | Admitting: Ophthalmology

## 2019-06-16 DIAGNOSIS — I10 Essential (primary) hypertension: Secondary | ICD-10-CM

## 2019-06-16 DIAGNOSIS — H35033 Hypertensive retinopathy, bilateral: Secondary | ICD-10-CM

## 2019-06-16 DIAGNOSIS — H3321 Serous retinal detachment, right eye: Secondary | ICD-10-CM

## 2019-06-16 DIAGNOSIS — Z961 Presence of intraocular lens: Secondary | ICD-10-CM

## 2019-06-16 DIAGNOSIS — H35413 Lattice degeneration of retina, bilateral: Secondary | ICD-10-CM

## 2019-06-16 DIAGNOSIS — H25811 Combined forms of age-related cataract, right eye: Secondary | ICD-10-CM

## 2019-06-16 DIAGNOSIS — H3581 Retinal edema: Secondary | ICD-10-CM

## 2019-06-16 DIAGNOSIS — H33322 Round hole, left eye: Secondary | ICD-10-CM

## 2019-07-14 ENCOUNTER — Other Ambulatory Visit: Payer: Self-pay

## 2019-07-14 MED ORDER — BALSALAZIDE DISODIUM 750 MG PO CAPS: ORAL_CAPSULE | ORAL | 0 refills | Status: DC

## 2019-11-16 ENCOUNTER — Other Ambulatory Visit: Payer: Self-pay | Admitting: Internal Medicine

## 2019-12-12 ENCOUNTER — Encounter: Payer: Self-pay | Admitting: Internal Medicine

## 2020-01-27 ENCOUNTER — Ambulatory Visit (AMBULATORY_SURGERY_CENTER): Payer: Self-pay | Admitting: *Deleted

## 2020-01-27 ENCOUNTER — Other Ambulatory Visit: Payer: Self-pay

## 2020-01-27 ENCOUNTER — Encounter: Payer: Self-pay | Admitting: Internal Medicine

## 2020-01-27 VITALS — Ht 65.0 in | Wt 205.0 lb

## 2020-01-27 DIAGNOSIS — K5150 Left sided colitis without complications: Secondary | ICD-10-CM

## 2020-01-27 NOTE — Progress Notes (Signed)
cov vacc x 2- 2nd- 11-10-2019  No egg or soy allergy known to patient  No issues with past sedation with any surgeries or procedures no intubation problems in the past  No diet pills per patient- OFF per pt at this time-  No home 02 use per patient  No blood thinners per patient  Pt denies issues with constipation  Pt did a 2 day Miralax prep last time  No A fib or A flutter  EMMI video to pt or MyChart  COVID 19 guidelines implemented in PV today     Due to the COVID-19 pandemic we are asking patients to follow these guidelines. Please only bring one care partner. Please be aware that your care partner may wait in the car in the parking lot or if they feel like they will be too hot to wait in the car, they may wait in the lobby on the 4th floor. All care partners are required to wear a mask the entire time (we do not have any that we can provide them), they need to practice social distancing, and we will do a Covid check for all patient's and care partners when you arrive. Also we will check their temperature and your temperature. If the care partner waits in their car they need to stay in the parking lot the entire time and we will call them on their cell phone when the patient is ready for discharge so they can bring the car to the front of the building. Also all patient's will need to wear a mask into building.  Pt verified name, DOB, address and insurance during PV today.  Instructions via my chart and Pt mailed instruction packet to included paper to complete and mail back to [facility] with addressed and stamped envelope, Emmi video, copy of consent form to read and not return, and instructions.  PV completed over the phone. Pt encouraged to call with questions or issues

## 2020-02-03 NOTE — Progress Notes (Signed)
[facility] Note  02/04/2020     CHIEF COMPLAINT Patient presents for Retina Follow Up s/p pneumatic cryopexy OD 12/04/17  HISTORY OF PRESENT ILLNESS: [name] is a 59 y.o. female who presents to the clinic today for:   HPI    Retina Follow Up    Patient presents with  Other.  In right eye.  This started 2 years ago.  Severity is moderate.  I, the attending physician,  performed the HPI with the patient and updated documentation appropriately.          Comments    Patient here for 2 years retina follow up for s/p RD OD. Patient states vision doing good. No eye pain.  Had cataract surgery OD Oct 2019.        Last edited by Bernarda Caffey, MD on 02/04/2020  8:39 AM. (History)    pt states she has been doing well, her mom was dx with wet macular degeneration within the last year  Referring physician: Aldean Ast, PA-C [address] [place] [address]  HISTORICAL INFORMATION:   Selected notes from the MEDICAL RECORD NUMBER Referred by Dr. Zigmund Daniel for Superior RD OD;  LEE- 06.10.19 (JDM) [BCVA OD: 20/LP OD: 20/20]  Ocular Hx- pseudophakia OS (05/2013), cataract OD, superior RD - break within lattice, HTN ret OU, vitreous opacities OU PMH- arthritis, sleep apnea, ulcerative colitis     CURRENT MEDICATIONS: Current Outpatient Medications (Ophthalmic Drugs)  Medication Sig  . LOTEMAX 0.5 % ophthalmic suspension INSTILL 1 DROP INTO LEFT EYE 4 TIMES A DAY (Patient not taking: Reported on 01/27/2020)  . neomycin-polymyxin-dexameth (MAXITROL) 0.1 % OINT Place 1 application into the right eye 4 (four) times daily. (Patient not taking: Reported on 12/19/2017)  . prednisoLONE acetate (PRED FORTE) 1 % ophthalmic suspension Place 1 drop into the right eye 4 (four) times daily. (Patient not taking: Reported on 01/27/2020)   No current facility-administered medications for this visit. (Ophthalmic Drugs)   Current Outpatient Medications (Other)   Medication Sig  . albuterol (PROVENTIL HFA;VENTOLIN HFA) 108 (90 BASE) MCG/ACT inhaler Inhale 2 puffs into the lungs every 6 (six) hours as needed.   . Ascorbic Acid (VITAMIN C PO) Take by mouth. 750 mg daily  . B Complex Vitamins (B COMPLEX PO) Take by mouth. (Patient not taking: Reported on 01/27/2020)  . balsalazide (COLAZAL) 750 MG capsule TAKE 3 CAPSULES 3 TIMES    DAILY  . BIOTIN PO Take by mouth.  . bisacodyl (DULCOLAX) 5 MG EC tablet Take 5 mg by mouth daily as needed for moderate constipation. For a total of 8 for a 2 day colon prep  . Calcium Carb-Cholecalciferol (CALCIUM + D3 PO) Take 2 tablets by mouth daily.  . Calcium Carbonate-Vitamin D (CALCIUM 600+D) 600-400 MG-UNIT per tablet Take 1 tablet by mouth 2 (two) times daily.    . clobetasol cream (TEMOVATE) 0.05 % Temovate 0.05 % topical cream  APPLY A THIN LAYER TO THE AFFECTED AREA(S) BY TOPICAL ROUTE 2 TIMES PER DAY  . diltiazem 2 % GEL Apply 1 application topically 2 (two) times daily. (Patient not taking: Reported on 01/27/2020)  . FLAX OIL-FISH OIL-BORAGE OIL PO Take by mouth.  . Fluticasone-Salmeterol (ADVAIR) 100-50 MCG/DOSE AEPB Inhale 1 puff into the lungs 2 (two) times daily.    Marland Kitchen ibuprofen (ADVIL) 600 MG tablet Take 600 mg by mouth every 6 (six) hours as needed.  . loratadine (LORATADINE ALLERGY RELIEF) 10 MG dissolvable  tablet Take 10 mg by mouth daily.    Marland Kitchen MAGNESIUM PO Take 250 mg by mouth daily. (Patient not taking: Reported on 01/27/2020)  . mesalamine (CANASA) 1000 MG suppository Place 1 suppository (1,000 mg total) rectally at bedtime. (Patient not taking: Reported on 01/27/2020)  . Misc Natural Products (LUTEIN 20 PO) Take by mouth.  . montelukast (SINGULAIR) 10 MG tablet Take 10 mg by mouth at bedtime.    . Multiple Vitamin (MULTIVITAMIN) tablet Take 1 tablet by mouth daily.    . nebivolol (BYSTOLIC) 2.5 MG tablet Take 2.5 mg by mouth daily.  . Nutritional Supplements (ESTROVEN PO) Take by mouth.  . phentermine  (ADIPEX-P) 37.5 MG tablet Take 37.5 mg by mouth daily. (Patient not taking: Reported on 01/27/2020)  . Simethicone (GAS-X EXTRA STRENGTH) 125 MG CAPS Take by mouth as needed.    . triamcinolone cream (KENALOG) 0.1 % APPLY THIN COAT TO AFFECTED AREA TWICE A DAY  . Zinc 30 MG CAPS Take by mouth daily.   Current Facility-Administered Medications (Other)  Medication Route  . 0.9 %  sodium chloride infusion Intravenous      REVIEW OF SYSTEMS: ROS    Positive for: Gastrointestinal, Skin, Musculoskeletal, Eyes, Respiratory   Negative for: Constitutional, Neurological, Genitourinary, HENT, Endocrine, Cardiovascular, Psychiatric, Allergic/Imm, Heme/Lymph   Last edited by Theodore Demark, COA on 02/04/2020  8:21 AM. (History)       ALLERGIES Allergies  Allergen Reactions  . Cefaclor Rash    PAST MEDICAL HISTORY Past Medical History:  Diagnosis Date  . Allergic rhinitis   . Allergy   . Anemia    iron deficient  . Anxiety   . Arthritis   . Asthma    inhaler  . Cataract    left removed, right removed   . Depression   . Eczema   . History of iron deficiency anemia   . Hx of adenomatous polyp of colon 06/05/2012   2013 single adenoma - repeat colonoscopy 2016-17  . Hypertension   . Retinal detachment   . Sleep apnea    wears c-pap  . Ulcerative colitis, left sided ([place]) 2003   Past Surgical History:  Procedure Laterality Date  . CARPAL TUNNEL RELEASE  2017   bilateral  . CATARACT EXTRACTION Bilateral 05/2013  . COLONOSCOPY  11/10/2008   ulcerative colitis  . COLONOSCOPY    . EYE SURGERY    . pneumatic retinopexy Right 12/04/2017   bubble is gone per pt  . POLYPECTOMY    . TONSILLECTOMY AND ADENOIDECTOMY  1966    FAMILY HISTORY Family History  Problem Relation Age of Onset  . Colon polyps Father   . Diabetes Father   . Heart disease Mother   . Breast cancer Maternal Grandmother   . Clotting disorder Daughter 24       deceased, blood clot  . Colon cancer Neg Hx    . Esophageal cancer Neg Hx   . Rectal cancer Neg Hx   . Stomach cancer Neg Hx     SOCIAL HISTORY Social History   Tobacco Use  . Smoking status: Never Smoker  . Smokeless tobacco: Never Used  Vaping Use  . Vaping Use: Never used  Substance Use Topics  . Alcohol use: Yes    Alcohol/week: 1.0 standard drink    Types: 1 Standard drinks or equivalent per week    Comment: occ  . Drug use: No         OPHTHALMIC EXAM:  Base Eye  Exam    Visual Acuity (Snellen - Linear)      Right Left   Dist [place] 20/40 -1 20/20 -2   Dist ph [place] 20/20        Tonometry (Tonopen, 8:17 AM)      Right Left   Pressure 13 16       Pupils      Dark Light Shape React APD   Right 5 3 Round Brisk None   Left 5 3 Round Brisk None       Visual Fields (Counting fingers)      Left Right    Full Full       Extraocular Movement      Right Left    Full, Ortho Full, Ortho       Neuro/Psych    Oriented x3: Yes   Mood/Affect: Normal       Dilation    Both eyes: 1.0% Mydriacyl, 2.5% Phenylephrine @ 8:17 AM        Slit Lamp and Fundus Exam    Slit Lamp Exam      Right Left   Lids/Lashes Dermatochalasis - upper lid, mild Meibomian gland dysfunction Dermatochalasis - upper lid, mild Telangiectasia   Conjunctiva/Sclera White and quiet White and quiet   Cornea Arcus, trace Punctate epithelial erosions, well healed temporal cataract wounds Arcus, 1-2+ central Punctate epithelial erosions, decreased TBUT   Anterior Chamber Deep and quiet, open angles  Deep and quiet   Iris Round and dilated Round and dilated   Lens PCIOL; trace Posterior capsular opacification inferiorly PC IOL in good position, 1+ Posterior capsular opacification - non-central   Vitreous Vitreous syneresis, pigment in anterior vitreous Vitreous syneresis       Fundus Exam      Right Left   Disc Pink and Sharp Pink and Sharp   C/D Ratio 0.3 0.2   Macula Good foveal reflex, flat, SRF , Retinal pigment epithelial mottling, No  heme or edema Flat, Good foveal reflex, mild Retinal pigment epithelial, mottling, mild Epiretinal membrane, No heme or edema   Vessels mild attenuation, mild tortuousity Mild Copper wiring, mildly Tortuous, Vascular attenuation   Periphery Retina reattached, 2 retinal tears at 1030 in line with one another in same meridian, good cryo and laser changes surrounding tears Attached, lattice degeneration at 1200 almost to ora, lattice at 0500 ora -- good laser surrounding both lesions          IMAGING AND PROCEDURES  Imaging and Procedures for @TODAY @  OCT, Retina - OU - Both Eyes       Right Eye Quality was good. Central Foveal Thickness: 294. Progression has been stable. Findings include normal foveal contour, no IRF, no SRF, epiretinal membrane (Interval development of mild ERM/pucker temporal macula; interval resolution ofTrace slivers of SRF).   Left Eye Quality was good. Central Foveal Thickness: 292. Progression has been stable. Findings include normal foveal contour, no IRF, no SRF (Trace ERM).   Notes *Images captured and stored on drive  Diagnosis / Impression:  OD: Interval development of mild ERM/pucker temporal macula; interval resolution ofTrace slivers of SRF OS: NFP, No IRF/SRF; tr ERM  Clinical management:  See below  Abbreviations: NFP - Normal foveal profile. CME - cystoid macular edema. PED - pigment epithelial detachment. IRF - intraretinal fluid. SRF - subretinal fluid. EZ - ellipsoid zone. ERM - epiretinal membrane. ORA - outer retinal atrophy. ORT - outer retinal tubulation. SRHM - subretinal hyper-reflective material  ASSESSMENT/PLAN:    ICD-10-CM   1. 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 Retinal hole of left eye  H33.322   4. Essential hypertension  I10   5. Hypertensive retinopathy of both eyes  H35.033   6. Pseudophakia, both eyes  Z96.1   7. Retinal edema  H35.81 OCT,  Retina - OU - Both Eyes    1. Macula-involving rhegmatogenous retinal detachment, OD-  - RD from 0900 to 0100 with SRF tracking into macula  - 2 tears at 1030 meridian   - s/p pneumatic cryopexy OD (6.11.19)   - s/p touch up laser retinopexy OD (06.14.19)  - BCVA improved to 20/20  - IOP okay at 13  - persistent tr slivers of SRF resolved  - F/U 12 months, sooner prn  2,3. Lattice degeneration w/ atrophic holes, OU  - lattice at 1230 and 5 oclock ora OS  - S/P laser retinopexy OS (07.24.19) -- laser looks good  - no new RT/RD  - Reviewed s/s of RT/RD  - Strict return precautions for any such RT/RD symptoms  4,5. Hypertensive retinopathy OU  - discussed importance of tight BP control  - monitor  6. Pseudophakia OU  - s/p CE/IOL OU [facility])  - beautiful surgeries, doing well  - monitor  7. No retinal edema on exam or OCT     Ophthalmic Meds Ordered this visit:  No orders of the defined types were placed in this encounter.      Return in about 1 year (around 02/03/2021) for f/u RD OD, DFE, OCT.  There are no Patient Instructions on file for this visit.   Explained the diagnoses, plan, and follow up with the patient and they expressed understanding.  Patient expressed understanding of the importance of proper follow up care.   This document serves as a record of services personally performed by Gardiner Sleeper, MD, PhD. It was created on their behalf by Roselee Nova, COMT. The creation of this record is the provider's dictation and/or activities during the visit.  Electronically signed by: Roselee Nova, COMT 02/04/20 10:24 PM  This document serves as a record of services personally performed by Gardiner Sleeper, MD, PhD. It was created on their behalf by San Jetty. Owens Shark, OA an ophthalmic technician. The creation of this record is the provider's dictation and/or activities during the visit.    Electronically signed by: San Jetty. Marguerita Merles 08.11.2021 10:24  PM   Gardiner Sleeper, M.D., Ph.D. Diseases & Surgery of the Retina and Vitreous [facility]   I have reviewed the above documentation for accuracy and completeness, and I agree with the above. Gardiner Sleeper, M.D., Ph.D. 02/04/20 10:24 PM   Abbreviations: M myopia (nearsighted); A astigmatism; H hyperopia (farsighted); P presbyopia; Mrx spectacle prescription;  CTL contact lenses; OD right eye; OS left eye; OU both eyes  XT exotropia; ET esotropia; PEK punctate epithelial keratitis; PEE punctate epithelial erosions; DES dry eye syndrome; MGD meibomian gland dysfunction; ATs artificial tears; PFAT's preservative free artificial tears; [place] nuclear sclerotic cataract; PSC posterior subcapsular cataract; ERM epi-retinal membrane; PVD posterior vitreous detachment; RD retinal detachment; DM diabetes mellitus; DR diabetic retinopathy; NPDR non-proliferative diabetic retinopathy; PDR proliferative diabetic retinopathy; CSME clinically significant macular edema; DME diabetic macular edema; dbh dot blot hemorrhages; CWS cotton wool spot; POAG primary open angle glaucoma; C/D cup-to-disc ratio; HVF humphrey visual field; GVF goldmann visual field;  OCT optical coherence tomography; IOP intraocular pressure; BRVO Branch retinal vein occlusion; CRVO central retinal vein occlusion; CRAO central retinal artery occlusion; BRAO branch retinal artery occlusion; RT retinal tear; SB scleral buckle; PPV pars plana vitrectomy; VH Vitreous hemorrhage; PRP panretinal laser photocoagulation; IVK intravitreal kenalog; VMT vitreomacular traction; MH Macular hole;  NVD neovascularization of the disc; NVE neovascularization elsewhere; AREDS age related eye disease study; ARMD age related macular degeneration; POAG primary open angle glaucoma; EBMD epithelial/anterior basement membrane dystrophy; ACIOL anterior chamber intraocular lens; IOL intraocular lens; PCIOL posterior chamber intraocular lens; Phaco/IOL  phacoemulsification with intraocular lens placement; [place] photorefractive keratectomy; LASIK laser assisted in situ keratomileusis; HTN hypertension; DM diabetes mellitus; COPD chronic obstructive pulmonary disease

## 2020-02-04 ENCOUNTER — Ambulatory Visit (INDEPENDENT_AMBULATORY_CARE_PROVIDER_SITE_OTHER): Payer: BC Managed Care – PPO | Admitting: Ophthalmology

## 2020-02-04 ENCOUNTER — Other Ambulatory Visit: Payer: Self-pay

## 2020-02-04 ENCOUNTER — Encounter (INDEPENDENT_AMBULATORY_CARE_PROVIDER_SITE_OTHER): Payer: Self-pay | Admitting: Ophthalmology

## 2020-02-04 DIAGNOSIS — H35033 Hypertensive retinopathy, bilateral: Secondary | ICD-10-CM

## 2020-02-04 DIAGNOSIS — H33322 Round hole, left eye: Secondary | ICD-10-CM | POA: Diagnosis not present

## 2020-02-04 DIAGNOSIS — I10 Essential (primary) hypertension: Secondary | ICD-10-CM

## 2020-02-04 DIAGNOSIS — H3321 Serous retinal detachment, right eye: Secondary | ICD-10-CM

## 2020-02-04 DIAGNOSIS — Z961 Presence of intraocular lens: Secondary | ICD-10-CM

## 2020-02-04 DIAGNOSIS — H3581 Retinal edema: Secondary | ICD-10-CM

## 2020-02-04 DIAGNOSIS — H35413 Lattice degeneration of retina, bilateral: Secondary | ICD-10-CM | POA: Diagnosis not present

## 2020-02-08 ENCOUNTER — Other Ambulatory Visit: Payer: Self-pay | Admitting: Internal Medicine

## 2020-02-11 ENCOUNTER — Ambulatory Visit (AMBULATORY_SURGERY_CENTER): Payer: BC Managed Care – PPO | Admitting: Internal Medicine

## 2020-02-11 ENCOUNTER — Encounter: Payer: Self-pay | Admitting: Internal Medicine

## 2020-02-11 ENCOUNTER — Other Ambulatory Visit: Payer: Self-pay

## 2020-02-11 VITALS — BP 128/73 | HR 67 | Temp 98.0°F | Resp 12 | Ht 65.0 in | Wt 205.0 lb

## 2020-02-11 DIAGNOSIS — K5150 Left sided colitis without complications: Secondary | ICD-10-CM | POA: Diagnosis present

## 2020-02-11 DIAGNOSIS — K529 Noninfective gastroenteritis and colitis, unspecified: Secondary | ICD-10-CM | POA: Diagnosis not present

## 2020-02-11 DIAGNOSIS — K635 Polyp of colon: Secondary | ICD-10-CM

## 2020-02-11 MED ORDER — SODIUM CHLORIDE 0.9 % IV SOLN: 500.0000 mL | Freq: Once | INTRAVENOUS | Status: DC

## 2020-02-11 NOTE — Progress Notes (Signed)
PT taken to PACU. Monitors in place. VSS. Report given to RN. 

## 2020-02-11 NOTE — Patient Instructions (Addendum)
Very slight inflammation suspected in part of the colon (small area).  Overall looks good.  Biopsies taken as per usual.  I will let you know.  I appreciate the opportunity to care for you. Gatha Mayer, MD, [facility]  Resume previous diet Continue present medications  await pathology results  YOU HAD AN ENDOSCOPIC PROCEDURE TODAY AT [place]:   Refer to the procedure report that was given to you for any specific questions about what was found during the examination.  If the procedure report does not answer your questions, please call your gastroenterologist to clarify.  If you requested that your care partner not be given the details of your procedure findings, then the procedure report has been included in a sealed envelope for you to review at your convenience later.  YOU SHOULD EXPECT: Some feelings of bloating in the abdomen. Passage of more gas than usual.  Walking can help get rid of the air that was put into your GI tract during the procedure and reduce the bloating. If you had a lower endoscopy (such as a colonoscopy or flexible sigmoidoscopy) you may notice spotting of blood in your stool or on the toilet paper. If you underwent a bowel prep for your procedure, you may not have a normal bowel movement for a few days.  Please Note:  You might notice some irritation and congestion in your nose or some drainage.  This is from the oxygen used during your procedure.  There is no need for concern and it should clear up in a day or so.  SYMPTOMS TO REPORT IMMEDIATELY:   Following lower endoscopy (colonoscopy or flexible sigmoidoscopy):  Excessive amounts of blood in the stool  Significant tenderness or worsening of abdominal pains  Swelling of the abdomen that is new, acute  Fever of 100F or higher   For urgent or emergent issues, a gastroenterologist can be reached at any hour by calling [phone]. Do not use MyChart messaging for urgent concerns.     DIET:  We do recommend a small meal at first, but then you may proceed to your regular diet.  Drink plenty of fluids but you should avoid alcoholic beverages for 24 hours.  ACTIVITY:  You should plan to take it easy for the rest of today and you should NOT DRIVE or use heavy machinery until tomorrow (because of the sedation medicines used during the test).    FOLLOW UP: Our staff will call the number listed on your records 48-72 hours following your procedure to check on you and address any questions or concerns that you may have regarding the information given to you following your procedure. If we do not reach you, we will leave a message.  We will attempt to reach you two times.  During this call, we will ask if you have developed any symptoms of COVID 19. If you develop any symptoms (ie: fever, flu-like symptoms, shortness of breath, cough etc.) before then, please call [phone].  If you test positive for Covid 19 in the 2 weeks post procedure, please call and report this information to [place].    If any biopsies were taken you will be contacted by phone or by letter within the next 1-3 weeks.  Please call us at [phone] if you have not heard about the biopsies in 3 weeks.    SIGNATURES/CONFIDENTIALITY: You and/or your care partner have signed paperwork which will be entered into your electronic medical record.  These signatures attest  to the fact that that the information above on your After Visit Summary has been reviewed and is understood.  Full responsibility of the confidentiality of this discharge information lies with you and/or your care-partner.

## 2020-02-11 NOTE — Op Note (Signed)
[address] Patient Name: [name] Procedure Date: 02/11/2020 10:48 AM MRN: [id number] Endoscopist: Gatha Mayer , MD Age: 59 Referring MD:  Date of Birth: 09-17-1960 Gender: Female Account #: [id number] Procedure:                Colonoscopy Indications:              High risk colon cancer surveillance: Ulcerative                            left sided colitis of 8 (or more) years duration Medicines:                Propofol per Anesthesia, Monitored Anesthesia Care Procedure:                Pre-Anesthesia Assessment:                           - Prior to the procedure, a History and Physical                            was performed, and patient medications and                            allergies were reviewed. The patient's tolerance of                            previous anesthesia was also reviewed. The risks                            and benefits of the procedure and the sedation                            options and risks were discussed with the patient.                            All questions were answered, and informed consent                            was obtained. Prior Anticoagulants: The patient has                            taken no previous anticoagulant or antiplatelet                            agents. ASA Grade Assessment: II - A patient with                            mild systemic disease. After reviewing the risks                            and benefits, the patient was deemed in                            satisfactory condition to undergo the procedure.  After obtaining informed consent, the colonoscope                            was passed under direct vision. Throughout the                            procedure, the patient's blood pressure, pulse, and                            oxygen saturations were monitored continuously. The                            Colonoscope was introduced through the anus and                             advanced to the the cecum, identified by                            appendiceal orifice and ileocecal valve. The                            colonoscopy was somewhat difficult due to a                            redundant colon and significant looping. Successful                            completion of the procedure was aided by applying                            abdominal pressure. The patient tolerated the                            procedure well. The quality of the bowel                            preparation was good. The ileocecal valve,                            appendiceal orifice, and rectum were photographed.                            The bowel preparation used was Miralax via split                            dose instruction. Scope In: 11:04:16 AM Scope Out: 11:31:48 AM Scope Withdrawal Time: 0 hours 15 minutes 59 seconds  Total Procedure Duration: 0 hours 27 minutes 32 seconds  Findings:                 The perianal and digital rectal examinations were                            normal.  Inflammation characterized by erythema, loss of                            vascularity and aphthous ulcerations was found in a                            continuous and circumferential pattern from the                            sigmoid colon to the descending colon. This was                            mild in severity. Biopsies were taken with a cold                            forceps for histology. Verification of patient                            identification for the specimen was done. Estimated                            blood loss was minimal.                           The exam was otherwise without abnormality. Complications:            No immediate complications. Estimated Blood Loss:     Estimated blood loss was minimal. Impression:               - Left-sided ulcerative colitis. Inflammation was                            found from the sigmoid  colon to the descending                            colon. This was mild in severity. Biopsied. Very                            mild segment distal descending and proximal                            sigmoid. Biopsy bottles 1) cecum, ascedning and                            transverse; 2) descenidng;3) sigmoid;4) rectum 2+                            biopsies every 10 cm                           - The examination was otherwise normal. Did not                            retroflex in rectum - narrow vault Recommendation:           -  Patient has a contact number available for                            emergencies. The signs and symptoms of potential                            delayed complications were discussed with the                            patient. Return to normal activities tomorrow.                            Written discharge instructions were provided to the                            patient.                           - Resume previous diet.                           - Continue present medications.                           - Await pathology results.                           - Repeat colonoscopy is recommended for                            surveillance. The colonoscopy date will be                            determined after pathology results from today's                            exam become available for review. Consider                            abdominal binder to facilitate scope passage Gatha Mayer, MD 02/11/2020 11:41:27 AM This report has been signed electronically.

## 2020-02-11 NOTE — Progress Notes (Signed)
Called to room to assist during endoscopic procedure.  Patient ID and intended procedure confirmed with present staff. Received instructions for my participation in the procedure from the performing physician.  

## 2020-02-13 ENCOUNTER — Telehealth: Payer: Self-pay

## 2020-02-13 NOTE — Telephone Encounter (Signed)
  Follow up Call-  Call back number 02/11/2020 11/13/2018 04/25/2018  Post procedure Call Back phone  # [phone]  Permission to leave phone message Yes Yes Yes  Some recent data might be hidden     Patient questions:  Do you have a fever, pain , or abdominal swelling? No. Pain Score  0 *  Have you tolerated food without any problems? Yes.    Have you been able to return to your normal activities? Yes.    Do you have any questions about your discharge instructions: Diet   No. Medications  No. Follow up visit  No.  Do you have questions or concerns about your Care? No.  Actions: * If pain score is 4 or above: 1. No action needed, pain <4.Have you developed a fever since your procedure? no  2.   Have you had an respiratory symptoms (SOB or cough) since your procedure? no  3.   Have you tested positive for COVID 19 since your procedure no  4.   Have you had any family members/close contacts diagnosed with the COVID 19 since your procedure?  no   If yes to any of these questions please route to Joylene John, RN and Joella Prince, RN

## 2020-02-16 ENCOUNTER — Encounter: Payer: Self-pay | Admitting: Internal Medicine

## 2020-07-05 ENCOUNTER — Other Ambulatory Visit: Payer: Self-pay | Admitting: Internal Medicine

## 2020-09-21 ENCOUNTER — Other Ambulatory Visit: Payer: Self-pay | Admitting: Internal Medicine

## 2020-12-10 ENCOUNTER — Encounter: Payer: Self-pay | Admitting: Internal Medicine

## 2020-12-13 ENCOUNTER — Other Ambulatory Visit: Payer: Self-pay | Admitting: Internal Medicine

## 2021-02-02 NOTE — Progress Notes (Signed)
[facility] Note  02/03/2021     CHIEF COMPLAINT Patient presents for Retina Follow Up s/p pneumatic cryopexy OD 12/04/17  HISTORY OF PRESENT ILLNESS: [name] is a 60 y.o. female who presents to the clinic today for:   HPI     Retina Follow Up   Patient presents with  Other.  In both eyes.  This started 1 year ago.  I, the attending physician,  performed the HPI with the patient and updated documentation appropriately.        Comments   Patient here for 1 year follow up for lattice degeneration OU. Patient states vision doing pretty good. No eye pain.       Last edited by Bernarda Caffey, MD on 02/03/2021  8:49 AM.     Referring physician: Aldean Ast, PA-C [id number] [place] [address]  HISTORICAL INFORMATION:   Selected notes from the MEDICAL RECORD NUMBER Referred by Dr. Zigmund Daniel for [address] RD OD;  LEE- 06.10.19 (JDM) [BCVA OD: 20/LP OD: 20/20]  Ocular Hx- pseudophakia OS (05/2013), cataract OD, superior RD - break within lattice, HTN ret OU, vitreous opacities OU PMH- arthritis, sleep apnea, ulcerative colitis     CURRENT MEDICATIONS: No current outpatient medications on file. (Ophthalmic Drugs)   No current facility-administered medications for this visit. (Ophthalmic Drugs)   Current Outpatient Medications (Other)  Medication Sig   albuterol (PROVENTIL HFA;VENTOLIN HFA) 108 (90 BASE) MCG/ACT inhaler Inhale 2 puffs into the lungs every 6 (six) hours as needed.    Ascorbic Acid (VITAMIN C PO) Take by mouth. 750 mg daily   balsalazide (COLAZAL) 750 MG capsule TAKE 3 CAPSULES 3 TIMES    DAILY   BIOTIN PO Take by mouth.   bisacodyl (DULCOLAX) 5 MG EC tablet Take 5 mg by mouth daily as needed for moderate constipation. For a total of 8 for a 2 day colon prep   Calcium Carb-Cholecalciferol (CALCIUM + D3 PO) Take 2 tablets by mouth daily.   clobetasol cream (TEMOVATE) 0.05 % Temovate 0.05 % topical cream  APPLY A THIN  LAYER TO THE AFFECTED AREA(S) BY TOPICAL ROUTE 2 TIMES PER DAY   FLAX OIL-FISH OIL-BORAGE OIL PO Take by mouth.   Fluticasone-Salmeterol (ADVAIR) 100-50 MCG/DOSE AEPB Inhale 1 puff into the lungs 2 (two) times daily.     ibuprofen (ADVIL) 600 MG tablet Take 600 mg by mouth every 6 (six) hours as needed.   loratadine (LORATADINE ALLERGY RELIEF) 10 MG dissolvable tablet Take 10 mg by mouth daily.     Misc Natural Products (LUTEIN 20 PO) Take by mouth.   montelukast (SINGULAIR) 10 MG tablet Take 10 mg by mouth at bedtime.     Multiple Vitamin (MULTIVITAMIN) tablet Take 1 tablet by mouth daily.     nebivolol (BYSTOLIC) 2.5 MG tablet Take 2.5 mg by mouth daily.   Nutritional Supplements (ESTROVEN PO) Take by mouth.   phentermine (ADIPEX-P) 37.5 MG tablet Take 37.5 mg by mouth daily. (Patient not taking: Reported on 01/27/2020)   Simethicone (GAS-X EXTRA STRENGTH) 125 MG CAPS Take by mouth as needed.     Zinc 30 MG CAPS Take by mouth daily.   No current facility-administered medications for this visit. (Other)      REVIEW OF SYSTEMS: ROS   Positive for: Gastrointestinal, Skin, Musculoskeletal, Eyes, Respiratory Negative for: Constitutional, Neurological, Genitourinary, HENT, Endocrine, Cardiovascular, Psychiatric, Allergic/Imm, Heme/Lymph Last edited by Theodore Demark, COA on 02/03/2021  8:04 AM.  ALLERGIES Allergies  Allergen Reactions   Cefaclor Rash    PAST MEDICAL HISTORY Past Medical History:  Diagnosis Date   Allergic rhinitis    Allergy    Anemia    iron deficient   Anxiety    Arthritis    Asthma    inhaler   Cataract    left removed, right removed    Depression    Eczema    History of iron deficiency anemia    Hx of adenomatous polyp of colon 06/05/2012   2013 single adenoma - repeat colonoscopy 2016-17   Hypertension    Retinal detachment    Sleep apnea    wears c-pap   Ulcerative colitis, left sided ([place]) 2003   Past Surgical History:  Procedure  Laterality Date   CARPAL TUNNEL RELEASE  2017   bilateral   CATARACT EXTRACTION Bilateral 05/2013   COLONOSCOPY  11/10/2008   ulcerative colitis   COLONOSCOPY     EYE SURGERY     pneumatic retinopexy Right 12/04/2017   bubble is gone per pt   POLYPECTOMY     TONSILLECTOMY AND ADENOIDECTOMY  1966    FAMILY HISTORY Family History  Problem Relation Age of Onset   Colon polyps Father    Diabetes Father    Heart disease Mother    Breast cancer Maternal Grandmother    Clotting disorder Daughter Sep 10, 2022       deceased, blood clot   Colon cancer Neg Hx    Esophageal cancer Neg Hx    Rectal cancer Neg Hx    Stomach cancer Neg Hx     SOCIAL HISTORY Social History   Tobacco Use   Smoking status: Never   Smokeless tobacco: Never  Vaping Use   Vaping Use: Never used  Substance Use Topics   Alcohol use: Yes    Alcohol/week: 1.0 standard drink    Types: 1 Standard drinks or equivalent per week    Comment: occ   Drug use: No         OPHTHALMIC EXAM:  Base Eye Exam     Visual Acuity (Snellen - Linear)       Right Left   Dist [place] 20/50 +2 20/20 -1   Dist ph  20/20 -2   Patient is mono vision OD near, OS distance        Tonometry (Tonopen, 8:01 AM)       Right Left   Pressure 14 17         Pupils       Dark Light Shape React APD   Right 5 3 Round Brisk None   Left 5 3 Round Brisk None         Visual Fields (Counting fingers)       Left Right    Full Full         Extraocular Movement       Right Left    Full, Ortho Full, Ortho         Neuro/Psych     Oriented x3: Yes   Mood/Affect: Normal         Dilation     Both eyes: 1.0% Mydriacyl, 2.5% Phenylephrine @ 8:01 AM           Slit Lamp and Fundus Exam     Slit Lamp Exam       Right Left   Lids/Lashes Dermatochalasis - upper lid, mild Meibomian gland dysfunction Dermatochalasis - upper lid, mild Telangiectasia, mild Meibomian gland  dysfunction   Conjunctiva/Sclera White and  quiet White and quiet   Cornea Arcus, 1+ Punctate epithelial erosions, mild tear film debris, well healed temporal cataract wounds Arcus, 1-2+ fine Punctate epithelial erosions   Anterior Chamber Deep and quiet, open angles  Deep and quiet   Iris Round and dilated Round and dilated   Lens PCIOL; trace Posterior capsular opacification inferiorly PC IOL in good position, 1+ Posterior capsular opacification - non-central   Vitreous Vitreous syneresis, trace pigment in anterior vitreous Vitreous syneresis         Fundus Exam       Right Left   Disc Pink and Sharp, Compact, focal temp PPP Pink and Sharp, Compact   C/D Ratio 0.3 0.2   Macula Good foveal reflex, flat, SRF , Retinal pigment epithelial mottling, No heme or edema, trace ERM Flat, Good foveal reflex, mild Retinal pigment epithelial, mottling, mild Epiretinal membrane, No heme or edema   Vessels mild attenuation, mild tortuousity, mild copper wiring Mild Copper wiring, mildly Tortuous, Vascular attenuation   Periphery Retina reattached, 2 retinal tears at 1030 in same linear meridian, good cryo and laser changes surrounding tears, no new RT/RD Attached, lattice degeneration at 1200 almost to ora, lattice at 0500 ora -- good laser surrounding both lesions, no new RT/RD            IMAGING AND PROCEDURES  Imaging and Procedures for @TODAY @  OCT, Retina - OU - Both Eyes       Right Eye Quality was good. Central Foveal Thickness: 291. Progression has improved. Findings include normal foveal contour, no IRF, no SRF, epiretinal membrane (Interval improvement in mild ERM and pucker.).   Left Eye Quality was good. Central Foveal Thickness: 303. Progression has worsened. Findings include normal foveal contour, no IRF, no SRF, macular pucker, epiretinal membrane (Interval development of mild ERM/pucker).   Notes *Images captured and stored on drive  Diagnosis / Impression:  Mild ERM OU OD: Interval improvement in mild  ERM/pucker. OS: Interval development of mild ERM/pucker  Clinical management:  See below  Abbreviations: NFP - Normal foveal profile. CME - cystoid macular edema. PED - pigment epithelial detachment. IRF - intraretinal fluid. SRF - subretinal fluid. EZ - ellipsoid zone. ERM - epiretinal membrane. ORA - outer retinal atrophy. ORT - outer retinal tubulation. SRHM - subretinal hyper-reflective material             ASSESSMENT/PLAN:    ICD-10-CM   1. 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 Retinal hole of left eye  H33.322     4. Epiretinal membrane (ERM) of both eyes  H35.373     5. Retinal edema  H35.81 OCT, Retina - OU - Both Eyes    6. Essential hypertension  I10     7. Hypertensive retinopathy of both eyes  H35.033     8. Pseudophakia, both eyes  Z96.1      1. Macula-involving rhegmatogenous retinal detachment, OD-  - RD from 0900 to 0100 with SRF tracking into macula  - 2 tears at 1030 meridian   - s/p pneumatic cryopexy OD (6.11.19)   - s/p touch up laser retinopexy OD (06.14.19)  - BCVA  20/20 -- stable  - IOP okay at 13  - F/U 1 yr, sooner prn -- DFE/OCT  2,3. Lattice degeneration w/ atrophic holes, OU  - lattice at 1230 and 5 oclock ora OS  -  S/P laser retinopexy OS (07.24.19) -- laser looks good  - no new RT/RD  - Reviewed s/s of RT/RD  - Strict return precautions for any such RT/RD symptoms   4,5. Epiretinal membrane, OU - The natural history, anatomy, potential for loss of vision, and treatment options including vitrectomy techniques and the complications of endophthalmitis, retinal detachment, vitreous hemorrhage, cataract progression and permanent vision loss discussed with the patient. - mild ERM OU - BCVA 20/20 OU - asymptomatic, no metamorphopsia - no indication for surgery at this time - monitor for now - f/u 1 year -- DFE/OCT   6,7.  Hypertensive retinopathy OU  - discussed importance  of tight BP control  - monitor   8. Pseudophakia OU  - s/p CE/IOL OU ([place])  - beautiful surgeries, doing well  - monitor     Ophthalmic Meds Ordered this visit:  No orders of the defined types were placed in this encounter.      Return in about 1 year (around 02/03/2022) for DFE, OCT.  There are no Patient Instructions on file for this visit.   Explained the diagnoses, plan, and follow up with the patient and they expressed understanding.  Patient expressed understanding of the importance of proper follow up care.   This document serves as a record of services personally performed by Gardiner Sleeper, MD, PhD. It was created on their behalf by Leonie Douglas, an ophthalmic technician. The creation of this record is the provider's dictation and/or activities during the visit.    Electronically signed by: Leonie Douglas COA, 02/03/21  1:27 PM  Gardiner Sleeper, M.D., Ph.D. Diseases & Surgery of the Retina and [place] 02/03/2021  I have reviewed the above documentation for accuracy and completeness, and I agree with the above. Gardiner Sleeper, M.D., Ph.D. 02/03/21 1:27 PM  Abbreviations: M myopia (nearsighted); A astigmatism; H hyperopia (farsighted); P presbyopia; Mrx spectacle prescription;  CTL contact lenses; OD right eye; OS left eye; OU both eyes  XT exotropia; ET esotropia; PEK punctate epithelial keratitis; PEE punctate epithelial erosions; DES dry eye syndrome; MGD meibomian gland dysfunction; ATs artificial tears; PFAT's preservative free artificial tears; [place] nuclear sclerotic cataract; PSC posterior subcapsular cataract; ERM epi-retinal membrane; PVD posterior vitreous detachment; RD retinal detachment; DM diabetes mellitus; DR diabetic retinopathy; NPDR non-proliferative diabetic retinopathy; PDR proliferative diabetic retinopathy; CSME clinically significant macular edema; DME diabetic macular edema; dbh dot blot  hemorrhages; CWS cotton wool spot; POAG primary open angle glaucoma; C/D cup-to-disc ratio; HVF humphrey visual field; GVF goldmann visual field; OCT optical coherence tomography; IOP intraocular pressure; BRVO Branch retinal vein occlusion; CRVO central retinal vein occlusion; CRAO central retinal artery occlusion; BRAO branch retinal artery occlusion; RT retinal tear; SB scleral buckle; PPV pars plana vitrectomy; VH Vitreous hemorrhage; PRP panretinal laser photocoagulation; IVK intravitreal kenalog; VMT vitreomacular traction; MH Macular hole;  NVD neovascularization of the disc; NVE neovascularization elsewhere; AREDS age related eye disease study; ARMD age related macular degeneration; POAG primary open angle glaucoma; EBMD epithelial/anterior basement membrane dystrophy; ACIOL anterior chamber intraocular lens; IOL intraocular lens; PCIOL posterior chamber intraocular lens; Phaco/IOL phacoemulsification with intraocular lens placement; [place] photorefractive keratectomy; LASIK laser assisted in situ keratomileusis; HTN hypertension; DM diabetes mellitus; COPD chronic obstructive pulmonary disease

## 2021-02-03 ENCOUNTER — Other Ambulatory Visit: Payer: Self-pay

## 2021-02-03 ENCOUNTER — Encounter (INDEPENDENT_AMBULATORY_CARE_PROVIDER_SITE_OTHER): Payer: Self-pay | Admitting: Ophthalmology

## 2021-02-03 ENCOUNTER — Ambulatory Visit (INDEPENDENT_AMBULATORY_CARE_PROVIDER_SITE_OTHER): Payer: BC Managed Care – PPO | Admitting: Ophthalmology

## 2021-02-03 DIAGNOSIS — H3321 Serous retinal detachment, right eye: Secondary | ICD-10-CM | POA: Diagnosis not present

## 2021-02-03 DIAGNOSIS — H35033 Hypertensive retinopathy, bilateral: Secondary | ICD-10-CM

## 2021-02-03 DIAGNOSIS — H35413 Lattice degeneration of retina, bilateral: Secondary | ICD-10-CM | POA: Diagnosis not present

## 2021-02-03 DIAGNOSIS — H3581 Retinal edema: Secondary | ICD-10-CM

## 2021-02-03 DIAGNOSIS — H33322 Round hole, left eye: Secondary | ICD-10-CM

## 2021-02-03 DIAGNOSIS — I10 Essential (primary) hypertension: Secondary | ICD-10-CM

## 2021-02-03 DIAGNOSIS — H35373 Puckering of macula, bilateral: Secondary | ICD-10-CM

## 2021-02-03 DIAGNOSIS — Z961 Presence of intraocular lens: Secondary | ICD-10-CM

## 2021-02-14 ENCOUNTER — Encounter: Payer: BC Managed Care – PPO | Admitting: Internal Medicine

## 2021-03-20 ENCOUNTER — Other Ambulatory Visit: Payer: Self-pay | Admitting: Internal Medicine

## 2021-05-01 ENCOUNTER — Encounter: Payer: Self-pay | Admitting: Internal Medicine

## 2021-06-09 ENCOUNTER — Encounter: Payer: Self-pay | Admitting: Internal Medicine

## 2021-06-18 ENCOUNTER — Other Ambulatory Visit: Payer: Self-pay | Admitting: Internal Medicine

## 2021-07-27 ENCOUNTER — Ambulatory Visit (AMBULATORY_SURGERY_CENTER): Payer: BC Managed Care – PPO | Admitting: *Deleted

## 2021-07-27 ENCOUNTER — Encounter: Payer: Self-pay | Admitting: Internal Medicine

## 2021-07-27 ENCOUNTER — Other Ambulatory Visit: Payer: Self-pay

## 2021-07-27 VITALS — Ht 65.0 in | Wt 190.0 lb

## 2021-07-27 DIAGNOSIS — K5150 Left sided colitis without complications: Secondary | ICD-10-CM

## 2021-07-27 NOTE — Progress Notes (Signed)
No egg or soy allergy known to patient  No issues known to pt with past sedation with any surgeries or procedures Patient denies ever being told they had issues or difficulty with intubation  No FH of Malignant Hyperthermia Pt is not on diet pills-is not taking Ozempic or Wegovy and will not take any more Phentermine until after procedure. Pt is not on  home 02  Pt is not on blood thinners  Pt denies issues with constipation  No A fib or A flutter     Due to the COVID-19 pandemic we are asking patients to follow certain guidelines in PV and the [place]   Pt aware of COVID protocols and [facility] guidelines   PV completed over the phone. Pt verified name, DOB, address and insurance during PV today.   Pt encouraged to call with questions or issues.  If pt has My chart, procedure instructions sent via My Chart

## 2021-08-08 ENCOUNTER — Encounter: Payer: Self-pay | Admitting: Internal Medicine

## 2021-08-09 ENCOUNTER — Encounter: Payer: Self-pay | Admitting: Internal Medicine

## 2021-08-09 ENCOUNTER — Ambulatory Visit (AMBULATORY_SURGERY_CENTER): Payer: BC Managed Care – PPO | Admitting: Internal Medicine

## 2021-08-09 ENCOUNTER — Other Ambulatory Visit: Payer: Self-pay

## 2021-08-09 VITALS — BP 139/80 | HR 70 | Temp 97.1°F | Resp 16 | Ht 65.0 in | Wt 180.0 lb

## 2021-08-09 DIAGNOSIS — K529 Noninfective gastroenteritis and colitis, unspecified: Secondary | ICD-10-CM | POA: Diagnosis not present

## 2021-08-09 DIAGNOSIS — K5150 Left sided colitis without complications: Secondary | ICD-10-CM | POA: Diagnosis present

## 2021-08-09 MED ORDER — SODIUM CHLORIDE 0.9 % IV SOLN: 500.0000 mL | Freq: Once | INTRAVENOUS | Status: DC

## 2021-08-09 NOTE — Patient Instructions (Addendum)
Things look good. I took the usual biopsies and will let you know.  Great work on [employer] and losing weight!  I appreciate the opportunity to care for you. Gatha Mayer, MD, FACG  YOU HAD AN ENDOSCOPIC PROCEDURE TODAY AT [facility]:   Refer to the procedure report that was given to you for any specific questions about what was found during the examination.  If the procedure report does not answer your questions, please call your gastroenterologist to clarify.  If you requested that your care partner not be given the details of your procedure findings, then the procedure report has been included in a sealed envelope for you to review at your convenience later.  YOU SHOULD EXPECT: Some feelings of bloating in the abdomen. Passage of more gas than usual.  Walking can help get rid of the air that was put into your GI tract during the procedure and reduce the bloating. If you had a lower endoscopy (such as a colonoscopy or flexible sigmoidoscopy) you may notice spotting of blood in your stool or on the toilet paper. If you underwent a bowel prep for your procedure, you may not have a normal bowel movement for a few days.  Please Note:  You might notice some irritation and congestion in your nose or some drainage.  This is from the oxygen used during your procedure.  There is no need for concern and it should clear up in a day or so.  SYMPTOMS TO REPORT IMMEDIATELY:  Following lower endoscopy (colonoscopy or flexible sigmoidoscopy):  Excessive amounts of blood in the stool  Significant tenderness or worsening of abdominal pains  Swelling of the abdomen that is new, acute  Fever of 100F or higher   For urgent or emergent issues, a gastroenterologist can be reached at any hour by calling [phone]. Do not use MyChart messaging for urgent concerns.    DIET:  We do recommend a small meal at first, but then you may proceed to your regular diet.  Drink plenty of  fluids but you should avoid alcoholic beverages for 24 hours.  ACTIVITY:  You should plan to take it easy for the rest of today and you should NOT DRIVE or use heavy machinery until tomorrow (because of the sedation medicines used during the test).    FOLLOW UP: Our staff will call the number listed on your records 48-72 hours following your procedure to check on you and address any questions or concerns that you may have regarding the information given to you following your procedure. If we do not reach you, we will leave a message.  We will attempt to reach you two times.  During this call, we will ask if you have developed any symptoms of COVID 19. If you develop any symptoms (ie: fever, flu-like symptoms, shortness of breath, cough etc.) before then, please call [phone].  If you test positive for Covid 19 in the 2 weeks post procedure, please call and report this information to [place].    If any biopsies were taken you will be contacted by phone or by letter within the next 1-3 weeks.  Please call us at [phone] if you have not heard about the biopsies in 3 weeks.    SIGNATURES/CONFIDENTIALITY: You and/or your care partner have signed paperwork which will be entered into your electronic medical record.  These signatures attest to the fact that that the information above on your After Visit Summary has been reviewed and  is understood.  Full responsibility of the confidentiality of this discharge information lies with you and/or your care-partner.

## 2021-08-09 NOTE — Progress Notes (Signed)
Pt awake, report to RN, VVS

## 2021-08-09 NOTE — Progress Notes (Signed)
VS- [name]  Pt's states no medical or surgical changes since previsit or office visit.  

## 2021-08-09 NOTE — Progress Notes (Signed)
[facility] History and Physical   Primary Care Physician:  Aldean Ast, PA-C   Reason for Procedure:   Left side UC  Plan:    colonoscopy     HPI: [name] is a 61 y.o. female here for surveillance colonoscopy.  Wt Readings from Last 3 Encounters:  08/09/21 180 lb (81.6 kg)  07/27/21 190 lb (86.2 kg)  02/11/20 205 lb (93 kg)      Past Medical History:  Diagnosis Date   Allergic rhinitis    Allergy    Anemia    iron deficient   Anxiety    Arthritis    Asthma    inhaler   Cataract    left removed, right removed    Depression    Eczema    History of iron deficiency anemia    Hx of adenomatous polyp of colon 06/05/2012   2013 single adenoma - repeat colonoscopy 2016-17   Hypertension    Retinal detachment    Sleep apnea    wears c-pap   Ulcerative colitis, left sided ([place]) 2003    Past Surgical History:  Procedure Laterality Date   CARPAL TUNNEL RELEASE  2017   bilateral   CATARACT EXTRACTION Bilateral 05/2013   COLONOSCOPY  11/10/2008   ulcerative colitis   COLONOSCOPY     EYE SURGERY     pneumatic retinopexy Right 12/04/2017   bubble is gone per pt   POLYPECTOMY     [place]    Prior to Admission medications   Medication Sig Start Date End Date Taking? Authorizing Provider  Ascorbic Acid (VITAMIN C PO) Take by mouth. 750 mg daily   Yes [provider]  balsalazide (COLAZAL) 750 MG capsule TAKE 3 CAPSULES 3 TIMES    DAILY 06/21/21  Yes Gatha Mayer, MD  BIOTIN PO Take by mouth.   Yes [provider]  bisacodyl (DULCOLAX) 5 MG EC tablet Take 5 mg by mouth daily as needed for moderate constipation. For a total of 8 for a 2 day colon prep   Yes [provider]  Calcium Carb-Cholecalciferol (CALCIUM + D3 PO) Take 2 tablets by mouth daily.   Yes [provider]  FLAX OIL-FISH OIL-BORAGE OIL PO Take by mouth.   Yes [provider]  Fluticasone-Salmeterol  (ADVAIR) 100-50 MCG/DOSE AEPB Inhale 1 puff into the lungs 2 (two) times daily.     Yes [provider]  loratadine (CLARITIN REDITABS) 10 MG dissolvable tablet Take 10 mg by mouth daily.     Yes [provider]  montelukast (SINGULAIR) 10 MG tablet Take 10 mg by mouth at bedtime.     Yes [provider]  Multiple Vitamin (MULTIVITAMIN) tablet Take 1 tablet by mouth daily.     Yes [provider]  nebivolol (BYSTOLIC) 2.5 MG tablet Take 2.5 mg by mouth daily.   Yes [provider]  nitrofurantoin (MACRODANTIN) 50 MG capsule Take 50 mg by mouth daily. 07/14/21  Yes [provider]  Nutritional Supplements (ESTROVEN PO) Take by mouth.   Yes [provider]  albuterol (PROVENTIL HFA;VENTOLIN HFA) 108 (90 BASE) MCG/ACT inhaler Inhale 2 puffs into the lungs every 6 (six) hours as needed.     [provider]  clobetasol cream (TEMOVATE) 0.05 % Temovate 0.05 % topical cream  APPLY A THIN LAYER TO THE AFFECTED AREA(S) BY TOPICAL ROUTE 2 TIMES PER DAY    [provider]  ibuprofen (ADVIL) 600 MG  tablet Take 600 mg by mouth every 6 (six) hours as needed. 09/30/19   [provider]  Misc Natural Products (LUTEIN 20 PO) Take by mouth. Patient not taking: Reported on 07/27/2021    [provider]  phentermine (ADIPEX-P) 37.5 MG tablet Take 37.5 mg by mouth daily. Patient not taking: Reported on 07/27/2021 12/09/17   [provider]  Simethicone 125 MG CAPS Take by mouth as needed.      [provider]  Zinc 30 MG CAPS Take by mouth daily.    [provider]    Current Outpatient Medications  Medication Sig Dispense Refill   Ascorbic Acid (VITAMIN C PO) Take by mouth. 750 mg daily     balsalazide (COLAZAL) 750 MG capsule TAKE 3 CAPSULES 3 TIMES    DAILY 810 capsule 0   BIOTIN PO Take by mouth.     bisacodyl (DULCOLAX) 5 MG EC tablet Take 5 mg by mouth daily as needed for moderate  constipation. For a total of 8 for a 2 day colon prep     Calcium Carb-Cholecalciferol (CALCIUM + D3 PO) Take 2 tablets by mouth daily.     FLAX OIL-FISH OIL-BORAGE OIL PO Take by mouth.     Fluticasone-Salmeterol (ADVAIR) 100-50 MCG/DOSE AEPB Inhale 1 puff into the lungs 2 (two) times daily.       loratadine (CLARITIN REDITABS) 10 MG dissolvable tablet Take 10 mg by mouth daily.       montelukast (SINGULAIR) 10 MG tablet Take 10 mg by mouth at bedtime.       Multiple Vitamin (MULTIVITAMIN) tablet Take 1 tablet by mouth daily.       nebivolol (BYSTOLIC) 2.5 MG tablet Take 2.5 mg by mouth daily.     nitrofurantoin (MACRODANTIN) 50 MG capsule Take 50 mg by mouth daily.     Nutritional Supplements (ESTROVEN PO) Take by mouth.     albuterol (PROVENTIL HFA;VENTOLIN HFA) 108 (90 BASE) MCG/ACT inhaler Inhale 2 puffs into the lungs every 6 (six) hours as needed.      clobetasol cream (TEMOVATE) 0.05 % Temovate 0.05 % topical cream  APPLY A THIN LAYER TO THE AFFECTED AREA(S) BY TOPICAL ROUTE 2 TIMES PER DAY     ibuprofen (ADVIL) 600 MG tablet Take 600 mg by mouth every 6 (six) hours as needed.     Misc Natural Products (LUTEIN 20 PO) Take by mouth. (Patient not taking: Reported on 07/27/2021)     phentermine (ADIPEX-P) 37.5 MG tablet Take 37.5 mg by mouth daily. (Patient not taking: Reported on 07/27/2021)  1   Simethicone 125 MG CAPS Take by mouth as needed.       Zinc 30 MG CAPS Take by mouth daily.     Current Facility-Administered Medications  Medication Dose Route Frequency Provider Last Rate Last Admin   0.9 %  sodium chloride infusion  500 mL Intravenous Once Gatha Mayer, MD        Allergies as of 08/09/2021 - Review Complete 08/09/2021  Allergen Reaction Noted   Cefaclor Rash     Family History  Problem Relation Age of Onset   Colon polyps Father    Diabetes Father    Heart disease Mother    Breast cancer Maternal Grandmother    Clotting disorder Daughter 60       deceased, blood  clot   Colon cancer Neg Hx    Esophageal cancer Neg Hx    Rectal cancer Neg Hx  Stomach cancer Neg Hx     Social History   Socioeconomic History   Marital status: Married    Spouse name: Not on file   Number of children: 2   Years of education: Not on file   Highest education level: Not on file  Occupational History   Occupation: insurance agent  Tobacco Use   Smoking status: Never   Smokeless tobacco: Never  Vaping Use   Vaping Use: Never used  Substance and Sexual Activity   Alcohol use: Yes    Alcohol/week: 1.0 standard drink    Types: 1 Standard drinks or equivalent per week    Comment: occ   Drug use: No     Review of Systems:  All other review of systems negative except as mentioned in the HPI.  Physical Exam: Vital signs BP (!) 121/95    Pulse 82    Temp (!) 97.1 F (36.2 C) (Skin)    Ht 5' 5"  (1.651 m)    Wt 180 lb (81.6 kg)    LMP 05/18/2014    SpO2 100%    BMI 29.95 kg/m   General:   Alert,  Well-developed, well-nourished, pleasant and cooperative in NAD Lungs:  Clear throughout to auscultation.   Heart:  Regular rate and rhythm; no murmurs, clicks, rubs,  or gallops. Abdomen:  Soft, nontender and nondistended. Normal bowel sounds.   Neuro/Psych:  Alert and cooperative. Normal mood and affect. A and O x 3   @Daimian Sudberry  Simonne Maffucci, MD, [facility] [phone] (pager) 08/09/2021 8:37 AM@

## 2021-08-09 NOTE — Op Note (Signed)
[facility] Patient Name: [name] Procedure Date: 08/09/2021 8:37 AM MRN: [id number] Endoscopist: Gatha Mayer , MD Age: 61 Referring MD:  Date of Birth: [birth year] Gender: Female Account #: [id number] Procedure:                Colonoscopy Indications:              High risk colon cancer surveillance: Ulcerative                            left sided colitis of 8 (or more) years duration,                            Last colonoscopy: 2021 Medicines:                Propofol per Anesthesia, Monitored Anesthesia Care Procedure:                Pre-Anesthesia Assessment:                           - Prior to the procedure, a History and Physical                            was performed, and patient medications and                            allergies were reviewed. The patient's tolerance of                            previous anesthesia was also reviewed. The risks                            and benefits of the procedure and the sedation                            options and risks were discussed with the patient.                            All questions were answered, and informed consent                            was obtained. Prior Anticoagulants: The patient has                            taken no previous anticoagulant or antiplatelet                            agents. ASA Grade Assessment: III - A patient with                            severe systemic disease. After reviewing the risks                            and benefits, the patient was deemed in  satisfactory condition to undergo the procedure.                           After obtaining informed consent, the colonoscope                            was passed under direct vision. Throughout the                            procedure, the patient's blood pressure, pulse, and                            oxygen saturations were monitored continuously. The                            CF [id number]  #[id number] was introduced through the anus                            and advanced to the the cecum, identified by                            appendiceal orifice and ileocecal valve. The                            colonoscopy was somewhat difficult due to                            significant looping. Successful completion of the                            procedure was aided by applying abdominal pressure                            and an abdominal binder. The patient tolerated the                            procedure well. The quality of the bowel                            preparation was good. The ileocecal valve,                            appendiceal orifice, and rectum were photographed. Scope In: 2:77:82 AM Scope Out: 9:08:12 AM Scope Withdrawal Time: 0 hours 15 minutes 59 seconds  Total Procedure Duration: 0 hours 21 minutes 25 seconds  Findings:                 The perianal and digital rectal examinations were                            normal.                           A diffuse area of erythematous and  vascular-pattern-decreased mucosa was found in the                            rectum and in the sigmoid colon. Biopsies were                            taken with a cold forceps for histology.                            Verification of patient identification for the                            specimen was done. Estimated blood loss was minimal.                           The exam was otherwise without abnormality on                            direct and retroflexion views.                           Biopsies for histology were taken with a cold                            forceps from the cecum, ascending colon, transverse                            colon and descending colon for evaluation of                            microscopic colitis. Complications:            No immediate complications. Estimated Blood Loss:     Estimated blood loss was  minimal. Impression:               - Erythematous and vascular-pattern-decreased                            mucosa in the rectum and in the sigmoid colon.                            Biopsied.                           - The examination was otherwise normal on direct                            and retroflexion views.                           - Biopsies were taken with a cold forceps from the                            cecum, ascending colon, transverse colon and  descending colon for evaluation of microscopic                            colitis. Biopsies as follows overall: Bottle # 1                            Cecum, ascending, transverse - random #2 Descending                            and prximal sigmoid (normal) 2 every 10 cm # 3                            distal sigmoid and rectum - mucosal erythema and                            loss of vascular pattern 2 every 10 cm Recommendation:           - Patient has a contact number available for                            emergencies. The signs and symptoms of potential                            delayed complications were discussed with the                            patient. Return to normal activities tomorrow.                            Written discharge instructions were provided to the                            patient.                           - Resume previous diet.                           - Continue present medications.                           - Await pathology results.                           - Repeat colonoscopy is recommended for                            surveillance. The colonoscopy date will be                            determined after pathology results from today's                            exam become available for review. USE ABDOMINAL  [place] SCOPE AGAIN Gatha Mayer, MD 08/09/2021 9:17:08 AM This report has been signed electronically.

## 2021-08-09 NOTE — Progress Notes (Signed)
Called to room to assist during endoscopic procedure.  Patient ID and intended procedure confirmed with present staff. Received instructions for my participation in the procedure from the performing physician.  

## 2021-08-11 ENCOUNTER — Telehealth: Payer: Self-pay | Admitting: *Deleted

## 2021-08-11 NOTE — Telephone Encounter (Signed)
°  Follow up Call-  Call back number 08/09/2021 02/11/2020  Post procedure Call Back phone  # [phone]  Permission to leave phone message Yes Yes  Some recent data might be hidden     Patient questions:  Do you have a fever, pain , or abdominal swelling? No. Pain Score  0 *  Have you tolerated food without any problems? Yes.    Have you been able to return to your normal activities? Yes.    Do you have any questions about your discharge instructions: Diet   No. Medications  No. Follow up visit  No.  Do you have questions or concerns about your Care? No.  Actions: * If pain score is 4 or above: No action needed, pain <4.

## 2021-08-17 ENCOUNTER — Encounter: Payer: Self-pay | Admitting: Obstetrics and Gynecology

## 2021-08-17 ENCOUNTER — Encounter: Payer: Self-pay | Admitting: Internal Medicine

## 2021-08-17 ENCOUNTER — Ambulatory Visit: Payer: BC Managed Care – PPO | Admitting: Obstetrics and Gynecology

## 2021-08-17 ENCOUNTER — Other Ambulatory Visit: Payer: Self-pay

## 2021-08-17 VITALS — BP 136/94 | HR 80 | Ht 65.0 in | Wt 180.0 lb

## 2021-08-17 DIAGNOSIS — N390 Urinary tract infection, site not specified: Secondary | ICD-10-CM | POA: Diagnosis not present

## 2021-08-17 DIAGNOSIS — N200 Calculus of kidney: Secondary | ICD-10-CM

## 2021-08-17 DIAGNOSIS — N952 Postmenopausal atrophic vaginitis: Secondary | ICD-10-CM | POA: Diagnosis not present

## 2021-08-17 DIAGNOSIS — N8110 Cystocele, unspecified: Secondary | ICD-10-CM

## 2021-08-17 DIAGNOSIS — N816 Rectocele: Secondary | ICD-10-CM

## 2021-08-17 DIAGNOSIS — N393 Stress incontinence (female) (male): Secondary | ICD-10-CM | POA: Diagnosis not present

## 2021-08-17 DIAGNOSIS — N812 Incomplete uterovaginal prolapse: Secondary | ICD-10-CM

## 2021-08-17 MED ORDER — ESTRADIOL 0.1 MG/GM VA CREA: TOPICAL_CREAM | VAGINAL | 11 refills | Status: AC

## 2021-08-17 NOTE — Progress Notes (Signed)
[facility] New Patient Evaluation and Consultation  Referring Provider: Aldean Ast, PA-C PCP: Mohammed Kindle Date of Service: 08/17/2021  SUBJECTIVE Chief Complaint: New Patient (Initial Visit) [name] is a 62 y.o. female here for a evaluation on recurrent UTI. Pt said she has had about 8 in the past 1 year.)  History of Present Illness: [name] is a 61 y.o. White or Caucasian female seen in consultation at the request of PA Aldean Ast for evaluation of recurrent urinary tract infections.    Review of records from Aldean Ast significant for: Has had recurrent UTIs over the last year which have grown enterococcus faecalis despite nitrofurantoin prophylaxis 94m daily. She has ulcerative colitis.   Urinary Symptoms: Leaks urine with cough/ sneeze Leaks occasionally, does not occur daily Pad use: 1-2 liners/ mini-pads per day.   She is not bothered by her UI symptoms.  Day time voids 8-12.  Nocturia: 1-2 times per night to void. Does not really have urgency.  Voiding dysfunction: she empties her bladder well.  does not use a catheter to empty bladder.  When urinating, she feels dribbling after finishing and the need to urinate multiple times in a row  UTIs: 8-10 UTI's in the last year.  Usually has low back pain, does not always have dysuria, sometimes does not have symptoms at all.  Has tried cranberry pills.  Denies history of blood in urine and kidney or bladder stones. Has one episode of pyelonephritis and required rocephin.   Pelvic Organ Prolapse Symptoms:                  She Denies a feeling of a bulge the vaginal area.   Bowel Symptom: Bowel movements: 2 time(s) per day Stool consistency: soft  Straining: no.  Splinting: yes.  Incomplete evacuation: yes.  She Admits to accidental bowel leakage / fecal incontinence  Occurs: periodically due to UC  Consistency with leakage: liquid Bowel regimen: none Last colonoscopy: Date  07/2021  Sexual Function Sexually active: yes.  Sexual orientation:  heterosexual Pain with sex: Yes, has discomfort due to dryness  Pelvic Pain Denies pelvic pain   Past Medical History:  Past Medical History:  Diagnosis Date   Allergic rhinitis    Allergy    Anemia    iron deficient   Anxiety    Arthritis    Asthma    inhaler   Cataract    left removed, right removed    Depression    Eczema    History of iron deficiency anemia    Hx of adenomatous polyp of colon 06/05/2012   2013 single adenoma - repeat colonoscopy 2016-17   Hypertension    Retinal detachment    Sleep apnea    wears c-pap   Ulcerative colitis, left sided ([facility] 2003     Past Surgical History:   Past Surgical History:  Procedure Laterality Date   CARPAL TUNNEL RELEASE  2017   bilateral   CATARACT EXTRACTION Bilateral 05/2013   COLONOSCOPY  11/10/2008   ulcerative colitis   COLONOSCOPY     EYE SURGERY     pneumatic retinopexy Right 12/04/2017   bubble is gone per pt   POLYPECTOMY     [facility]    Past OB/GYN History: OB History  Gravida Para Term Preterm AB Living  2         2  SAB IAB Ectopic Multiple Live Births  2    # Outcome Date GA Lbr Len/2nd Weight Sex Delivery Anes PTL Lv  2 Gravida           1 Gravida             Vaginal deliveries: 2 Menopausal: Yes, Denies vaginal bleeding since menopause Last pap smear was 05/2021- normal.  Any history of abnormal pap smears: no.   Medications: She has a current medication list which includes the following prescription(s): albuterol, ascorbic acid, balsalazide, biotin, bisacodyl, calcium carb-cholecalciferol, estradiol, flax oil-fish oil-borage oil, fluticasone-salmeterol, loratadine, misc natural products, montelukast, multivitamin, nebivolol, nitrofurantoin, nutritional supplements, phentermine, probiotic product, and simethicone.   Allergies: Patient is allergic to cefaclor.   Social History:   Social History   Tobacco Use   Smoking status: Never   Smokeless tobacco: Never  Vaping Use   Vaping Use: Never used  Substance Use Topics   Alcohol use: Yes    Alcohol/week: 1.0 standard drink    Types: 1 Standard drinks or equivalent per week    Comment: occ   Drug use: No    Relationship status: married She lives with husband.   She is employed as an Licensed conveyancer. Regular exercise: No History of abuse: No  Family History:   Family History  Problem Relation Age of Onset   Colon polyps Father    Diabetes Father    Heart disease Mother    Breast cancer Maternal Grandmother    Clotting disorder Daughter 30       deceased, blood clot   Colon cancer Neg Hx    Esophageal cancer Neg Hx    Rectal cancer Neg Hx    Stomach cancer Neg Hx      Review of Systems: Review of Systems  Constitutional:  Negative for fever, malaise/fatigue and weight loss.  Respiratory:  Positive for cough. Negative for shortness of breath and wheezing.   Cardiovascular:  Negative for chest pain, palpitations and leg swelling.  Gastrointestinal:  Negative for abdominal pain and blood in stool.  Genitourinary:  Negative for dysuria.  Musculoskeletal:  Negative for myalgias.  Skin:  Negative for rash.  Neurological:  Positive for headaches. Negative for dizziness.  Endo/Heme/Allergies:  Does not bruise/bleed easily.       + hot flashes  Psychiatric/Behavioral:  Negative for depression. The patient is not nervous/anxious.     OBJECTIVE Physical Exam: Vitals:   08/17/21 0848  BP: (!) 136/94  Pulse: 80  Weight: 180 lb (81.6 kg)  Height: 5' 5"  (1.651 m)    Physical Exam Constitutional:      General: She is not in acute distress. Pulmonary:     Effort: Pulmonary effort is normal.  Abdominal:     General: There is no distension.     Palpations: Abdomen is soft.     Tenderness: There is no abdominal tenderness. There is no rebound.  Musculoskeletal:        General:  No swelling. Normal range of motion.  Skin:    General: Skin is warm and dry.     Findings: No rash.  Neurological:     Mental Status: She is alert and oriented to person, place, and time.  Psychiatric:        Mood and Affect: Mood normal.        Behavior: Behavior normal.     GU / Detailed Urogynecologic Evaluation:  Pelvic Exam: Normal external female genitalia; Bartholin's and Skene's glands normal in appearance; urethral meatus normal in  appearance, no urethral masses or discharge.   CST: negative  Speculum exam reveals normal vaginal mucosa with atrophy. Cervix normal appearance with small polyp,  Uterus normal single, nontender. Adnexa no mass, fullness, tenderness.     Pelvic floor strength III/V  Pelvic floor musculature: Right levator non-tender, Right obturator non-tender, Left levator non-tender, Left obturator non-tender  POP-Q:   POP-Q  -2                                            Aa   -2                                           Ba  -5                                              C   3.5                                            Gh  4                                            Pb  9                                            tvl   -1.5                                            Ap  -1.5                                            Bp  -7                                              D     Rectal Exam:  Normal external rectum  Post-Void Residual (PVR) by Bladder Scan: In order to evaluate bladder emptying, we discussed obtaining a postvoid residual and she agreed to this procedure.  Procedure: The ultrasound unit was placed on the patient's abdomen in the suprapubic region after the patient had voided. A PVR of 8 ml was obtained by bladder scan.  Laboratory Results: POC urine: small leukocytes, negative nitrites, trace blood   ASSESSMENT AND PLAN Ms. Talamante is a 60 y.o. with:  1. Recurrent urinary tract infection   2. Vaginal atrophy   3.  Kidney stone   4. SUI (stress urinary incontinence, female)   5.  Prolapse of anterior vaginal wall   6. Prolapse of posterior vaginal wall   7. Uterovaginal prolapse, incomplete     rUTI/ vaginal atrophy - For treatment of recurrent urinary tract infections, we discussed management of recurrent UTIs including prophylaxis with a daily low dose antibiotic, transvaginal estrogen therapy, D-mannose, and cranberry supplements.  We discussed the role of diagnostic testing such as cystoscopy and upper tract imaging.   - She will stay on the nitrofurantoin prophylaxis for now - We reviewed that she should only test urine if symptomatic, otherwise does not need test of cure.  - Will prescribe vaginal estrogen- estrace cream 0.5g nightly for two weeks then twice weekly after.  - Plan for CT Abdomen and pelvis wo contrast to rule out presence of kidney stones, especially since she has back pain with infections - Will plan for cystoscopy in office  3. SUI - For treatment of stress urinary incontinence,  non-surgical options include expectant management, weight loss, physical therapy, as well as a pessary. Surgical options are also available.  - Not that bothersome to her right now, she will work on kegel exercises at home  4. Stage I anterior, Stage I posterior, Stage I apical prolapse - Mild prolapse noted on exam, asymptomatic. We reviewed to avoid straining as this can worsen her symptoms.   Return for cystoscopy  Jaquita Folds, MD   Medical Decision Making:  - Reviewed/ ordered a clinical laboratory test - Reviewed/ ordered a radiologic study - Reviewed/ ordered medicine test - Review and summation of prior records

## 2021-08-22 NOTE — Progress Notes (Unsigned)
PA required for a CT ABD pelvis w/o contrast. Authorization # [id number] Auth thru: 08/22/2021- 09/20/2021  Pt was notified and will call and schedule the CT.

## 2021-08-31 ENCOUNTER — Other Ambulatory Visit: Payer: Self-pay

## 2021-08-31 ENCOUNTER — Ambulatory Visit (HOSPITAL_BASED_OUTPATIENT_CLINIC_OR_DEPARTMENT_OTHER)
Admission: RE | Admit: 2021-08-31 | Discharge: 2021-08-31 | Disposition: A | Discharge status: Home / Self Care | Payer: BC Managed Care – PPO | Source: Ambulatory Visit | Attending: Obstetrics and Gynecology | Admitting: Obstetrics and Gynecology

## 2021-08-31 DIAGNOSIS — N200 Calculus of kidney: Secondary | ICD-10-CM | POA: Diagnosis present

## 2021-09-01 NOTE — Addendum Note (Signed)
Addended by: Jaquita Folds on: 09/01/2021 02:27 PM   Modules accepted: Orders

## 2021-09-02 ENCOUNTER — Other Ambulatory Visit: Payer: BC Managed Care – PPO | Admitting: Obstetrics and Gynecology

## 2021-09-12 ENCOUNTER — Other Ambulatory Visit: Payer: Self-pay | Admitting: Urology

## 2021-09-16 ENCOUNTER — Other Ambulatory Visit: Payer: Self-pay | Admitting: Internal Medicine

## 2021-09-19 NOTE — Progress Notes (Signed)
Talked with pt. meds and hx reviewed. Arrival time 0600. Clear liquids until 0400. Ok to take am meds that morning except antibiotic. To stop fish oil. Driver secured ?

## 2021-09-20 NOTE — H&P (Signed)
cc: urolithiasis  ? ?09/12/21: 61 yo woman with a history of recurrent UTIs over the last year found to have a 1 cm proximal right ureteral calculus on CT of abdomen and pelvis done on 08/31/2021. Patient has no pain. She has never had stones before but does have a family history. She has a personal history of ulcerative colitis. CT scan also shows a nonobstructing left lower pole renal calculus. Patient has been on vaginal estradiol cream as well as suppressive antibiotics for UTI prevention.  ? ?  ?ALLERGIES:  ?  ? ?MEDICATIONS: Advair Diskus  ?Biotin  ?Bystolic 2.5 mg tablet  ?Calcium  ?Colazal 750 mg capsule  ?Dulcolax  ?Estradiol  ?Flax Seed Oil  ?Loratadine  ?Lutein  ?Multiple Vitamin  ?Nitrofurantoin  ?Phentermine Hcl  ?Probiotic  ?Simethicone 125 mg capsule  ?Singulair  ?Vitamin C  ?  ? ?GU PSH: None  ?   ?PSH Notes: Pneumatic Retinopexy.  ?Cataracts surgery both eyes.  ? ?NON-GU PSH: Carpal tunnel surgery ?Tonsillectomy ? ?  ? ?GU PMH: None  ? ?NON-GU PMH: Arthritis ?Asthma ?Depression ?Hypertension ?Sleep Apnea ?  ? ?FAMILY HISTORY: Atrial Fibrillation - Mother ?Blood In Urine - Father ?Kidney Cancer - Father ?Kidney Stones - Father ?Tuberculosis - Aunt  ? ?SOCIAL HISTORY: Marital Status: Married ?Preferred Language: [place]; Ethnicity: Not Hispanic Or Latino; Race: White ?Drinks 2 drinks per week.  ?Drinks 1 caffeinated drink per day. ?  ? ?REVIEW OF SYSTEMS:    ?GU Review Female:   Patient reports frequent urination, get up at night to urinate, leakage of urine, trouble starting your stream, and have to strain to urinate. Patient denies hard to postpone urination, burning /pain with urination, stream starts and stops, and being pregnant.  ?Gastrointestinal (Upper):   Patient denies nausea, vomiting, and indigestion/ heartburn.  ?Gastrointestinal (Lower):   Patient denies diarrhea and constipation.  ?Constitutional:   Patient reports fatigue. Patient denies fever, night sweats, and weight loss.  ?Skin:    Patient denies skin rash/ lesion and itching.  ?Eyes:   Patient denies blurred vision and double vision.  ?Ears/ Nose/ Throat:   Patient reports sinus problems. Patient denies sore throat.  ?Hematologic/Lymphatic:   Patient denies swollen glands and easy bruising.  ?Cardiovascular:   Patient denies leg swelling and chest pains.  ?Respiratory:   Patient denies cough and shortness of breath.  ?Endocrine:   Patient denies excessive thirst.  ?Musculoskeletal:   Patient denies back pain and joint pain.  ?Neurological:   Patient reports headaches. Patient denies dizziness.  ?Psychologic:   Patient denies depression and anxiety.  ? ?VITAL SIGNS:    ?  09/12/2021 11:43 AM  ?Weight 180 lb / 81.65 kg  ?BP 133/83 mmHg  ?Pulse 71 /min  ?Temperature 98.6 F / 37 C  ? ?MULTI-SYSTEM PHYSICAL EXAMINATION:    ?Constitutional: Well-nourished. No physical deformities. Normally developed. Good grooming.  ?Neck: Neck symmetrical, not swollen. Normal tracheal position.  ?Respiratory: No labored breathing, no use of accessory muscles.   ?Skin: No paleness, no jaundice, no cyanosis. No lesion, no ulcer, no rash.  ?Neurologic / Psychiatric: Oriented to time, oriented to place, oriented to person. No depression, no anxiety, no agitation.  ?Eyes: Normal conjunctivae. Normal eyelids.  ?Ears, Nose, Mouth, and Throat: Left ear no scars, no lesions, no masses. Right ear no scars, no lesions, no masses. Nose no scars, no lesions, no masses. Normal hearing. Normal lips.  ?Musculoskeletal: Normal gait and station of head and neck.  ? ?  ?  Complexity of Data:  ?Records Review:   Previous Hospital Records, Previous Patient Records, POC Tool  ?Urine Test Review:   Urinalysis  ?X-Ray Review: C.T. Abdomen/Pelvis: Reviewed Films. Reviewed Report. Discussed With Patient. IMPRESSION:  ?1. Moderate right hydronephrosis and proximal hydroureter caused by  ?obstructive 1 cm calculus just distal to the UPJ.  ?2. Nonobstructive left nephrolithiasis.  ?3.  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  ? ? ?Electronically Signed  ?By: Fidela Salisbury M.D.  ?On: 08/31/2021 16:16 ?  ?Notes:                     08/07/2016: Estimated GFR 83  ? ?PROCEDURES:    ? ?     Urinalysis w/Scope ?Dipstick Dipstick Cont'd Micro  ?Color: Yellow Bilirubin: Neg mg/dL WBC/hpf: 6 - 10/hpf  ?Appearance: Clear Ketones: Neg mg/dL RBC/hpf: 0 - 2/hpf  ?Specific Gravity: 1.015 Blood: Neg ery/uL Bacteria: Rare (0-9/hpf)  ?pH: 6.0 Protein: Trace mg/dL Cystals: NS (Not Seen)  ?Glucose: Neg mg/dL Urobilinogen: 0.2 mg/dL Casts: NS (Not Seen)  ?  Nitrites: Neg Trichomonas: Not Present  ?  Leukocyte Esterase: 1+ leu/uL Mucous: Not Present  ?    Epithelial Cells: 0 - 5/hpf  ?    Yeast: NS (Not Seen)  ?    Sperm: Not Present  ? ? ?ASSESSMENT:  ?    ICD-10 Details  ?1 GU:   Ureteral calculus - C58.8 Acute, Uncomplicated  ?2   Renal calculus - N20.0 Chronic, Stable  ?3   Chronic cystitis (w/o hematuria) - N30.20 Chronic, Stable  ? ?PLAN:    ? ?      Document ?Letter(s):  Created for Patient: Clinical Summary  ? ? ?     Notes:   1. Right ureteral calculus:  ?-Discussed location and size of stone which will likely not pass on its own  ?-Management options include ESWL versus ureteroscopy laser lithotripsy  ?-Risks and benefits of both of these procedures were discussed in detail and patient has elected to move forward with ESWL; risk for this procedure include but are not not limited to pain, bleeding, retroperitoneal hematoma, page kidney, inability to remove stone, infection, damage to surrounding structures  ? ? ?2. Left renal calculus: Discussed that at the moment it is nonobstructing and does not pose an immediate threat to the patient's renal function; it is also visible on CT scan and we could treat with either ESWL or ureteroscopy in the future  ? ?3. Chronic cystitis:  ?-Gave patient informational handout on recurrent UTIs  ?-We discussed stone  prevention strategies including drinking at least 64 ounces of water daily, limiting baths, no soap in the vagina  ?-Patient to remain on vaginal estradiol and suppressive therapy with Macrobid  ?-I have also recommended d-mannose/cranberry probiotic   ? ?

## 2021-09-22 ENCOUNTER — Encounter (HOSPITAL_BASED_OUTPATIENT_CLINIC_OR_DEPARTMENT_OTHER)
Admission: RE | Disposition: A | Discharge status: Home / Self Care | Payer: Self-pay | Source: Home / Self Care | Attending: Urology

## 2021-09-22 ENCOUNTER — Other Ambulatory Visit: Payer: Self-pay

## 2021-09-22 ENCOUNTER — Encounter (HOSPITAL_BASED_OUTPATIENT_CLINIC_OR_DEPARTMENT_OTHER): Payer: Self-pay | Admitting: Urology

## 2021-09-22 ENCOUNTER — Ambulatory Visit (HOSPITAL_COMMUNITY): Payer: BC Managed Care – PPO

## 2021-09-22 ENCOUNTER — Ambulatory Visit (HOSPITAL_BASED_OUTPATIENT_CLINIC_OR_DEPARTMENT_OTHER)
Admission: RE | Admit: 2021-09-22 | Discharge: 2021-09-22 | Disposition: A | Discharge status: Home / Self Care | Payer: BC Managed Care – PPO | Attending: Urology | Admitting: Urology

## 2021-09-22 DIAGNOSIS — N202 Calculus of kidney with calculus of ureter: Secondary | ICD-10-CM | POA: Insufficient documentation

## 2021-09-22 DIAGNOSIS — N3020 Other chronic cystitis without hematuria: Secondary | ICD-10-CM | POA: Diagnosis not present

## 2021-09-22 DIAGNOSIS — Z87440 Personal history of urinary (tract) infections: Secondary | ICD-10-CM | POA: Diagnosis not present

## 2021-09-22 HISTORY — DX: Other complications of anesthesia, initial encounter: T88.59XA

## 2021-09-22 HISTORY — PX: EXTRACORPOREAL SHOCK WAVE LITHOTRIPSY: SHX1557

## 2021-09-22 HISTORY — DX: Other specified postprocedural states: Z98.890

## 2021-09-22 SURGERY — LITHOTRIPSY, ESWL
Anesthesia: LOCAL | Laterality: Right

## 2021-09-22 MED ORDER — DIPHENHYDRAMINE HCL 25 MG PO CAPS
ORAL_CAPSULE | ORAL | Status: AC
  Filled 2021-09-22: qty 1

## 2021-09-22 MED ORDER — CIPROFLOXACIN HCL 500 MG PO TABS
ORAL_TABLET | ORAL | Status: AC
  Filled 2021-09-22: qty 1

## 2021-09-22 MED ORDER — CIPROFLOXACIN HCL 500 MG PO TABS
500.0000 mg | ORAL_TABLET | ORAL | Status: AC
  Administered 2021-09-22: 500 mg via ORAL

## 2021-09-22 MED ORDER — DIAZEPAM 5 MG PO TABS
ORAL_TABLET | ORAL | Status: AC
  Filled 2021-09-22: qty 2

## 2021-09-22 MED ORDER — DIPHENHYDRAMINE HCL 25 MG PO CAPS
25.0000 mg | ORAL_CAPSULE | ORAL | Status: AC
  Administered 2021-09-22: 25 mg via ORAL

## 2021-09-22 MED ORDER — OXYCODONE-ACETAMINOPHEN 5-325 MG PO TABS: 1.0000 | ORAL_TABLET | Freq: Four times a day (QID) | ORAL | 0 refills | Status: DC | PRN

## 2021-09-22 MED ORDER — SODIUM CHLORIDE 0.9 % IV SOLN
INTRAVENOUS | Status: DC
Start: 2021-09-22 — End: 2021-09-22

## 2021-09-22 MED ORDER — DIAZEPAM 5 MG PO TABS
10.0000 mg | ORAL_TABLET | ORAL | Status: AC
  Administered 2021-09-22: 10 mg via ORAL

## 2021-09-22 NOTE — Interval H&P Note (Signed)
History and Physical Interval Note: ? ?09/22/2021 ?7:29 AM ? ?[name]  has presented today for surgery, with the diagnosis of RIGHT URETERAL CALCULUS.  The various methods of treatment have been discussed with the patient and family. After consideration of risks, benefits and other options for treatment, the patient has consented to  Procedure(s): ?EXTRACORPOREAL SHOCK WAVE LITHOTRIPSY (ESWL) (Right) as a surgical intervention.  The patient's history has been reviewed, patient examined, no change in status, stable for surgery.  I have reviewed the patient's chart and labs.  Questions were answered to the patient's satisfaction.   ? ? ?Worth Kober A Chaitanya Amedee ? ? ?

## 2021-09-22 NOTE — Discharge Instructions (Addendum)
I have reviewed discharge instructions in detail with the patient. They will follow-up with me or their physician as scheduled. My nurse will also be calling the patients as per protocol.   ? ? ? ?

## 2021-09-23 ENCOUNTER — Encounter (HOSPITAL_BASED_OUTPATIENT_CLINIC_OR_DEPARTMENT_OTHER): Payer: Self-pay | Admitting: Urology

## 2022-01-31 NOTE — Progress Notes (Signed)
[facility] Note  02/07/2022     CHIEF COMPLAINT Patient presents for Retina Follow Up s/p pneumatic cryopexy OD 12/04/17  HISTORY OF PRESENT ILLNESS: [name] is a 61 y.o. female who presents to the clinic today for:   HPI     Retina Follow Up   Patient presents with  Other.  In both eyes.  This started years ago.  Duration of 1 year.  Since onset it is stable.  I, the attending physician,  performed the HPI with the patient and updated documentation appropriately.        Comments   Patient feels that the cataract is getting cloudy. She denies flashes and floaters.      Last edited by Bernarda Caffey, MD on 02/07/2022  9:11 AM.    Pt states vision is stable  Referring physician: Aldean Ast, PA-C [address] [place] [address]  HISTORICAL INFORMATION:   Selected notes from the MEDICAL RECORD NUMBER Referred by Dr. Zigmund Daniel for Superior RD OD;  LEE- 06.10.19 (JDM) [BCVA OD: 20/LP OD: 20/20]  Ocular Hx- pseudophakia OS (05/2013), cataract OD, superior RD - break within lattice, HTN ret OU, vitreous opacities OU PMH- arthritis, sleep apnea, ulcerative colitis     CURRENT MEDICATIONS: No current outpatient medications on file. (Ophthalmic Drugs)   No current facility-administered medications for this visit. (Ophthalmic Drugs)   Current Outpatient Medications (Other)  Medication Sig   albuterol (PROVENTIL HFA;VENTOLIN HFA) 108 (90 BASE) MCG/ACT inhaler Inhale 2 puffs into the lungs every 6 (six) hours as needed.    Ascorbic Acid (VITAMIN C PO) Take by mouth. 750 mg daily   balsalazide (COLAZAL) 750 MG capsule TAKE 3 CAPSULES 3 TIMES    DAILY   BIOTIN PO Take by mouth.   bisacodyl (DULCOLAX) 5 MG EC tablet Take 5 mg by mouth daily as needed for moderate constipation. For a total of 8 for a 2 day colon prep   Calcium Carb-Cholecalciferol (CALCIUM + D3 PO) Take 2 tablets by mouth daily.   estradiol (ESTRACE) 0.1 MG/GM vaginal  cream Place 0.5g nightly for two weeks then twice a week after   FLAX OIL-FISH OIL-BORAGE OIL PO Take by mouth.   Fluticasone-Salmeterol (ADVAIR) 100-50 MCG/DOSE AEPB Inhale 1 puff into the lungs 2 (two) times daily.     loratadine (CLARITIN REDITABS) 10 MG dissolvable tablet Take 10 mg by mouth daily.     Misc Natural Products (LUTEIN 20 PO) Take by mouth.   montelukast (SINGULAIR) 10 MG tablet Take 10 mg by mouth at bedtime.     Multiple Vitamin (MULTIVITAMIN) tablet Take 1 tablet by mouth daily.     nebivolol (BYSTOLIC) 2.5 MG tablet Take 2.5 mg by mouth daily.   nitrofurantoin (MACRODANTIN) 50 MG capsule Take 50 mg by mouth daily.   Nutritional Supplements (ESTROVEN PO) Take by mouth.   oxyCODONE-acetaminophen (PERCOCET) 5-325 MG tablet Take 1-2 tablets by mouth every 6 (six) hours as needed for severe pain.   phentermine (ADIPEX-P) 37.5 MG tablet Take 37.5 mg by mouth daily.   Probiotic Product (PROBIOTIC-10 PO) Take by mouth.   Simethicone 125 MG CAPS Take by mouth as needed.     No current facility-administered medications for this visit. (Other)   REVIEW OF SYSTEMS: ROS   Positive for: Gastrointestinal, Skin, Musculoskeletal, Eyes, Respiratory Negative for: Constitutional, Neurological, Genitourinary, HENT, Endocrine, Cardiovascular, Psychiatric, Allergic/Imm, Heme/Lymph Last edited by Annie Paras, COT on 02/07/2022  7:58 AM.  ALLERGIES Allergies  Allergen Reactions   Cefaclor Rash   PAST MEDICAL HISTORY Past Medical History:  Diagnosis Date   Allergic rhinitis    Allergy    Anemia    iron deficient   Anxiety    Arthritis    Asthma    inhaler   Cataract    left removed, right removed    Complication of anesthesia    Depression    Eczema    History of iron deficiency anemia    Hx of adenomatous polyp of colon 06/05/2012   2013 single adenoma - repeat colonoscopy 2016-17   Hypertension    PONV (postoperative nausea and vomiting)    Retinal  detachment    Sleep apnea    wears c-pap   Ulcerative colitis, left sided ([place]) 2003   Past Surgical History:  Procedure Laterality Date   CARPAL TUNNEL RELEASE  2017   bilateral   CATARACT EXTRACTION Bilateral 05/2013   COLONOSCOPY  11/10/2008   ulcerative colitis   COLONOSCOPY     EXTRACORPOREAL SHOCK WAVE LITHOTRIPSY Right 09/22/2021   Procedure: EXTRACORPOREAL SHOCK WAVE LITHOTRIPSY (ESWL);  Surgeon: Bjorn Loser, MD;  Location: [facility];  Service: Urology;  Laterality: Right;   EYE SURGERY     pneumatic retinopexy Right 12/04/2017   bubble is gone per pt   POLYPECTOMY     TONSILLECTOMY AND ADENOIDECTOMY  1966   FAMILY HISTORY Family History  Problem Relation Age of Onset   Colon polyps Father    Diabetes Father    Heart disease Mother    Breast cancer Maternal Grandmother    Clotting disorder Daughter 45       deceased, blood clot   Colon cancer Neg Hx    Esophageal cancer Neg Hx    Rectal cancer Neg Hx    Stomach cancer Neg Hx    SOCIAL HISTORY Social History   Tobacco Use   Smoking status: Never   Smokeless tobacco: Never  Vaping Use   Vaping Use: Never used  Substance Use Topics   Alcohol use: Yes    Alcohol/week: 1.0 standard drink of alcohol    Types: 1 Standard drinks or equivalent per week    Comment: occ   Drug use: No       OPHTHALMIC EXAM:  Base Eye Exam     Visual Acuity (Snellen - Linear)       Right Left   Dist [place] 20/40 +2 20/20   Dist ph [place] 20/20 +2          Tonometry (Tonopen, 8:01 AM)       Right Left   Pressure 16 16         Pupils       Dark Light Shape React APD   Right 5 3 Round Brisk None   Left 5 3 Round Brisk None         Visual Fields       Left Right    Full Full         Extraocular Movement       Right Left    Full, Ortho Full, Ortho         Neuro/Psych     Oriented x3: Yes   Mood/Affect: Normal         Dilation     Both eyes: 2.5% Phenylephrine, 1.0%  Mydriacyl @ 7:59 AM           Slit Lamp and Fundus Exam  Slit Lamp Exam       Right Left   Lids/Lashes Dermatochalasis - upper lid, mild Meibomian gland dysfunction Dermatochalasis - upper lid, mild Telangiectasia, mild Meibomian gland dysfunction   Conjunctiva/Sclera White and quiet White and quiet   Cornea Arcus, 1+ Punctate epithelial erosions, mild tear film debris, well healed temporal cataract wounds Arcus, 1-2+ fine Punctate epithelial erosions   Anterior Chamber Deep and quiet, open angles  Deep and quiet   Iris Round and dilated Round and dilated   Lens PCIOL; trace Posterior capsular opacification inferiorly PC IOL in good position, 1+ Posterior capsular opacification - non-central   Anterior Vitreous Vitreous syneresis, trace pigment in anterior vitreous Vitreous syneresis         Fundus Exam       Right Left   Disc Pink and Sharp, Compact, focal temp PPP Pink and Sharp, Compact   C/D Ratio 0.3 0.2   Macula Flat, dgod foveal reflex, Retinal pigment epithelial mottling, No heme or edema, trace ERM Flat, Good foveal reflex, mild Retinal pigment epithelial, mottling, mild Epiretinal membrane with striae nasal macula, No heme or edema   Vessels attenuated, Tortuous mild attenuation, mild tortuosity   Periphery Retina reattached, 2 retinal tears at 1030 in same linear meridian, good cryo and laser changes surrounding tears, no new RT/RD, No heme Attached, lattice degeneration at 1200 almost to ora, lattice at 0500 ora -- good laser surrounding both lesions, no new RT/RD           IMAGING AND PROCEDURES  Imaging and Procedures for @TODAY @  OCT, Retina - OU - Both Eyes       Right Eye Quality was good. Central Foveal Thickness: 299. Progression has been stable. Findings include normal foveal contour, no IRF, no SRF.   Left Eye Quality was good. Central Foveal Thickness: 300. Progression has been stable. Findings include normal foveal contour, no IRF, no SRF,  epiretinal membrane, macular pucker (Stable ERM/pucker nasal macula).   Notes *Images captured and stored on drive  Diagnosis / Impression:  NFP; no IRF/SRF OU OS: Stable ERM/pucker nasal macula  Clinical management:  See below  Abbreviations: NFP - Normal foveal profile. CME - cystoid macular edema. PED - pigment epithelial detachment. IRF - intraretinal fluid. SRF - subretinal fluid. EZ - ellipsoid zone. ERM - epiretinal membrane. ORA - outer retinal atrophy. ORT - outer retinal tubulation. SRHM - subretinal hyper-reflective material             ASSESSMENT/PLAN:    ICD-10-CM   1. 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 Retinal hole of left eye  H33.322     4. Epiretinal membrane (ERM) of left eye  H35.372 OCT, Retina - OU - Both Eyes    5. Essential hypertension  I10     6. Hypertensive retinopathy of both eyes  H35.033     7. Pseudophakia, both eyes  Z96.1      1. Macula-involving rhegmatogenous retinal detachment, OD-  - RD from 0900 to 0100 with SRF tracking into macula  - 2 tears at 1030 meridian   - s/p pneumatic cryopexy OD (6.11.19)   - s/p touch up laser retinopexy OD (06.14.19)  - BCVA  20/20 -- stable  - IOP okay at 16  - F/U 1 yr, sooner prn -- DFE/OCT  2,3. Lattice degeneration w/ atrophic holes, OU  - lattice at 1230 and 5 oclock ora  OS  - S/P laser retinopexy OS (07.24.19) -- good laser surounding  - no new RT/RD  - Reviewed s/s of RT/RD  - Strict return precautions for any such RT/RD symptoms   4. Epiretinal membrane, OS - mild ERM w/ pucker OS -- stable - BCVA 20/20 OS - asymptomatic, no metamorphopsia - no indication for surgery at this time - monitor for now - f/u 1 year -- DFE/OCT   5,6. Hypertensive retinopathy OU  - discussed importance of tight BP control  - monitor   7. Pseudophakia OU  - s/p CE/IOL OU ([place])  - beautiful surgeries, doing well  -  monitor   Ophthalmic Meds Ordered this visit:  No orders of the defined types were placed in this encounter.    Return in about 1 year (around 02/08/2023) for f/u ERM OU, DFE, OCT.  There are no Patient Instructions on file for this visit.   Explained the diagnoses, plan, and follow up with the patient and they expressed understanding.  Patient expressed understanding of the importance of proper follow up care.   This document serves as a record of services personally performed by Gardiner Sleeper, MD, PhD. It was created on their behalf by Renaldo Reel, [place] an ophthalmic technician. The creation of this record is the provider's dictation and/or activities during the visit.    Electronically signed by:  Renaldo Reel, COT  01/31/22 9:13 AM   This document serves as a record of services personally performed by Gardiner Sleeper, MD, PhD. It was created on their behalf by San Jetty. Owens Shark, OA an ophthalmic technician. The creation of this record is the provider's dictation and/or activities during the visit.    Electronically signed by: San Jetty. Owens Shark, [place] 08.15.2023 9:13 AM  Gardiner Sleeper, M.D., Ph.D. Diseases & Surgery of the Retina and Vitreous [facility]  I have reviewed the above documentation for accuracy and completeness, and I agree with the above. Gardiner Sleeper, M.D., Ph.D. 02/07/22 9:15 AM   Abbreviations: M myopia (nearsighted); A astigmatism; H hyperopia (farsighted); P presbyopia; Mrx spectacle prescription;  CTL contact lenses; OD right eye; OS left eye; OU both eyes  XT exotropia; ET esotropia; PEK punctate epithelial keratitis; PEE punctate epithelial erosions; DES dry eye syndrome; MGD meibomian gland dysfunction; ATs artificial tears; PFAT's preservative free artificial tears; [place] nuclear sclerotic cataract; PSC posterior subcapsular cataract; ERM epi-retinal membrane; PVD posterior vitreous detachment; RD retinal detachment; DM diabetes  mellitus; DR diabetic retinopathy; NPDR non-proliferative diabetic retinopathy; PDR proliferative diabetic retinopathy; CSME clinically significant macular edema; DME diabetic macular edema; dbh dot blot hemorrhages; CWS cotton wool spot; POAG primary open angle glaucoma; C/D cup-to-disc ratio; HVF humphrey visual field; GVF goldmann visual field; OCT optical coherence tomography; IOP intraocular pressure; BRVO Branch retinal vein occlusion; CRVO central retinal vein occlusion; CRAO central retinal artery occlusion; BRAO branch retinal artery occlusion; RT retinal tear; SB scleral buckle; PPV pars plana vitrectomy; VH Vitreous hemorrhage; PRP panretinal laser photocoagulation; IVK intravitreal kenalog; VMT vitreomacular traction; MH Macular hole;  NVD neovascularization of the disc; NVE neovascularization elsewhere; AREDS age related eye disease study; ARMD age related macular degeneration; POAG primary open angle glaucoma; EBMD epithelial/anterior basement membrane dystrophy; ACIOL anterior chamber intraocular lens; IOL intraocular lens; PCIOL posterior chamber intraocular lens; Phaco/IOL phacoemulsification with intraocular lens placement; [place] photorefractive keratectomy; LASIK laser assisted in situ keratomileusis; HTN hypertension; DM diabetes mellitus; COPD chronic obstructive pulmonary disease

## 2022-02-05 ENCOUNTER — Other Ambulatory Visit: Payer: Self-pay | Admitting: Internal Medicine

## 2022-02-07 ENCOUNTER — Ambulatory Visit (INDEPENDENT_AMBULATORY_CARE_PROVIDER_SITE_OTHER): Payer: BC Managed Care – PPO | Admitting: Ophthalmology

## 2022-02-07 ENCOUNTER — Encounter (INDEPENDENT_AMBULATORY_CARE_PROVIDER_SITE_OTHER): Payer: Self-pay | Admitting: Ophthalmology

## 2022-02-07 DIAGNOSIS — I10 Essential (primary) hypertension: Secondary | ICD-10-CM

## 2022-02-07 DIAGNOSIS — H35372 Puckering of macula, left eye: Secondary | ICD-10-CM | POA: Diagnosis not present

## 2022-02-07 DIAGNOSIS — H33322 Round hole, left eye: Secondary | ICD-10-CM

## 2022-02-07 DIAGNOSIS — H35033 Hypertensive retinopathy, bilateral: Secondary | ICD-10-CM | POA: Diagnosis not present

## 2022-02-07 DIAGNOSIS — H35413 Lattice degeneration of retina, bilateral: Secondary | ICD-10-CM

## 2022-02-07 DIAGNOSIS — Z961 Presence of intraocular lens: Secondary | ICD-10-CM

## 2022-02-07 DIAGNOSIS — H3321 Serous retinal detachment, right eye: Secondary | ICD-10-CM

## 2022-02-07 DIAGNOSIS — H35373 Puckering of macula, bilateral: Secondary | ICD-10-CM

## 2022-04-23 ENCOUNTER — Other Ambulatory Visit: Payer: Self-pay | Admitting: Internal Medicine

## 2022-07-18 ENCOUNTER — Other Ambulatory Visit: Payer: Self-pay | Admitting: Urology

## 2022-07-31 ENCOUNTER — Encounter (HOSPITAL_BASED_OUTPATIENT_CLINIC_OR_DEPARTMENT_OTHER): Payer: Self-pay | Admitting: Urology

## 2022-07-31 NOTE — Progress Notes (Signed)
Pre-op phone call complete. Procedure date and arrival time confirmed. Patient allergies, medical history, and medications verified. Patient to arrive at 0600. Patient to be NPO at midnight. Patient advised to stop all vitamins before procedure and to take the blood pressure medication the morning of. Patient to bring CPAP machine for procedure. Driver secured.

## 2022-08-02 NOTE — H&P (Signed)
cc: urolithiasis   09/12/21: 62 yo woman with a history of recurrent UTIs over the last year found to have a 1 cm proximal right ureteral calculus on CT of abdomen and pelvis done on 08/31/2021. Patient has no pain. She has never had stones before but does have a family history. She has a personal history of ulcerative colitis. CT scan also shows a nonobstructing left lower pole renal calculus. Patient has been on vaginal estradiol cream as well as suppressive antibiotics for UTI prevention.   09/26/21: Above history noted. The patient is status post right ESWL 09/22/2021 due to an obstructing right ureteral calculus. She presents today with undulating fevers for the past 72 hours of 100 to 102 F that responds promptly with Tylenol. AFVSS today. She has no flank pain or discomfort after her lithotripsy. Currently on macrobid 50 mg daily for UTI prophylaxis.   31-Oct-2021: At last visit she had stable hydronephrosis on the right when compared to prior CT imaging. There appeared to be a collection of fragmented stone pieces in the right UVJ region. Her Macrobid was stopped and she was placed on cephalexin. Urine culture assessed at that time was negative for bacterial growth. Close observation with appropriate ED follow-up instructions given for worsening symptomology. She is now back today for follow-up exam. She brings in with her a large volume of stone material that she has passed. She has had no recurrence of fevers. She has had no significant pain or discomfort following lithotripsy. She is voiding at her baseline with absence of bothersome symptomology. She is no longer taking tamsulosin and has since completed previously prescribed cephalexin.   12/29/2021: 62 year old woman with a history of recurrent UTIs and urolithiasis here for follow-up after ESWL in March 2023 on the right. She was last seen in Oct 31, 2021 at which time she had passed a large amount of stone burden. Stone composition was calcium oxalate  and calcium apatite. She has a known nonobstructing left renal calculus but is not wanting to have this treated yet. Patient had a 4-day period where she developed urinary frequency, urgency and pain June 15 to 19. She is unsure if it was a stone moving or UTI. Symptoms have resolved and she is back to baseline. Urinalysis today does show bacteriuria and pyuria.   07/05/2022: 62-year-old woman with a history of urolithiasis and recurrent UTIs here for follow-up. She has not had any UTIs or passed any stones in the last 6 months. She has a known 11 mm left sided renal calculus.     ALLERGIES: CEFACLOR    MEDICATIONS: Advair Diskus  Balsalazide Disodium  Biotin  Bystolic 2.5 mg tablet  Calcium  Colazal 750 mg capsule  Estradiol  Loratadine  Multiple Vitamin  Nebivolol Hcl 5 mg tablet  Phentermine Hcl  Probiotic  Simethicone 125 mg capsule  Singulair  Vitamin C     GU PSH: ESWL - 09/22/2021       PSH Notes: Pneumatic Retinopexy.  Cataracts surgery both eyes.   NON-GU PSH: Carpal tunnel surgery Tonsillectomy     GU PMH: Acute Cystitis/UTI - 12/29/2021 Chronic cystitis (w/o hematuria) - 12/29/2021, - 31-Oct-2021, - 09/26/2021, - 09/12/2021 Renal calculus - 12/29/2021, - Oct 31, 2021, - 09/26/2021, - 09/12/2021 Ureteral calculus - Oct 31, 2021, - 09/12/2021    NON-GU PMH: Arthritis Asthma Depression Hypertension Sleep Apnea    FAMILY HISTORY: Atrial Fibrillation - Mother Blood In Urine - Father Kidney Cancer - Father Kidney Stones - Father Tuberculosis - Aunt   SOCIAL  HISTORY: Marital Status: Married Preferred Language: English; Ethnicity: Not Hispanic Or Latino; Race: White Drinks 2 drinks per week.  Drinks 1 caffeinated drink per day.    REVIEW OF SYSTEMS:    GU Review Female:   Patient reports burning /pain with urination. Patient denies frequent urination, hard to postpone urination, get up at night to urinate, leakage of urine, stream starts and stops, trouble starting your  stream, have to strain to urinate, and being pregnant.  Gastrointestinal (Upper):   Patient denies nausea, vomiting, and indigestion/ heartburn.  Gastrointestinal (Lower):   Patient denies diarrhea and constipation.  Constitutional:   Patient denies fever, night sweats, weight loss, and fatigue.  Skin:   Patient denies skin rash/ lesion and itching.  Eyes:   Patient denies blurred vision and double vision.  Ears/ Nose/ Throat:   Patient denies sore throat and sinus problems.  Hematologic/Lymphatic:   Patient denies swollen glands and easy bruising.  Cardiovascular:   Patient denies leg swelling and chest pains.  Respiratory:   Patient denies cough and shortness of breath.  Endocrine:   Patient denies excessive thirst.  Musculoskeletal:   Patient denies back pain and joint pain.  Neurological:   Patient denies headaches and dizziness.  Psychologic:   Patient denies depression and anxiety.   VITAL SIGNS:      07/05/2022 02:46 PM  BP 129/88 mmHg  Pulse 65 /min  Temperature 98.4 F / 36.8 C   MULTI-SYSTEM PHYSICAL EXAMINATION:    Constitutional: Well-nourished. No physical deformities. Normally developed. Good grooming.  Neck: Neck symmetrical, not swollen. Normal tracheal position.  Respiratory: No labored breathing, no use of accessory muscles.   Skin: No paleness, no jaundice, no cyanosis. No lesion, no ulcer, no rash.  Neurologic / Psychiatric: Oriented to time, oriented to place, oriented to person. No depression, no anxiety, no agitation.  Eyes: Normal conjunctivae. Normal eyelids.  Ears, Nose, Mouth, and Throat: Left ear no scars, no lesions, no masses. Right ear no scars, no lesions, no masses. Nose no scars, no lesions, no masses. Normal hearing. Normal lips.  Musculoskeletal: Normal gait and station of head and neck.     Complexity of Data:  Records Review:   Previous Patient Records, POC Tool  Urine Test Review:   Urinalysis  X-Ray Review: KUB: Reviewed Films. Discussed With  Patient. Patient with 3 KUBs in 2023 so hold off on imaging today. 11 mm nonobstructing renal calculus on the left.    PROCEDURES:          Urinalysis w/Scope Dipstick Dipstick Cont'd Micro  Color: Yellow Bilirubin: Neg mg/dL WBC/hpf: 10 - 20/hpf  Appearance: Cloudy Ketones: Neg mg/dL RBC/hpf: 0 - 2/hpf  Specific Gravity: 1.025 Blood: Neg ery/uL Bacteria: Few (10-25/hpf)  pH: 6.0 Protein: Neg mg/dL Cystals: NS (Not Seen)  Glucose: Neg mg/dL Urobilinogen: 0.2 mg/dL Casts: NS (Not Seen)    Nitrites: Neg Trichomonas: Not Present    Leukocyte Esterase: Neg leu/uL Mucous: Present      Epithelial Cells: 0 - 5/hpf      Yeast: NS (Not Seen)      Sperm: Not Present    ASSESSMENT:      ICD-10 Details  1 GU:   Chronic cystitis (w/o hematuria) - N30.20 Chronic, Stable  2   Renal calculus - N20.0 Chronic, Stable  3 NON-GU:   Bacteriuria - R82.71 Chronic, Stable   PLAN:            Medications New Meds: Estradiol 0.01 % cream with  applicator Place 1 g of cream in the vagina twice a week   #60  3 Refill(s)  Pharmacy Name:  [id number] Address:  [address]  [place] [address] Phone:  [phone] Fax:  [phone]           Orders Labs CULTURE, URINE          Document Letter(s):  Created for Patient: Clinical Summary         Notes:   1. Renal calculus:  -Patient has recovered from right as well and is now interested in treating the left-sided stone.  -11 mm left renal calculus present on last 3 KUBs. We decided not to move forward with KUB today as this has been stable over the last year. She will have a KUB the morning of her procedure. Risks and benefits of the procedure discussed with the patient in detail including but not limited to pain, bleeding, infection, perinephric hematoma, retroperitoneal bleed, page kidney, failure to fragment stone, ureteral obstruction.  -She would like to be scheduled for February early morning   2. Post menopausal  atrophic vaginitis: Vaginal estradiol cream refilled today   3. Asymptomatic bacteriuria: We will send urine for culture and pretreat her prior to ESWL if positive. She is otherwise feeling well and would not typically treat this.   cc: KAldean Ast PA

## 2022-08-04 ENCOUNTER — Encounter (HOSPITAL_BASED_OUTPATIENT_CLINIC_OR_DEPARTMENT_OTHER): Payer: Self-pay | Admitting: Urology

## 2022-08-04 ENCOUNTER — Encounter (HOSPITAL_BASED_OUTPATIENT_CLINIC_OR_DEPARTMENT_OTHER)
Admission: RE | Disposition: A | Discharge status: Home / Self Care | Payer: Self-pay | Source: Home / Self Care | Attending: Urology

## 2022-08-04 ENCOUNTER — Other Ambulatory Visit: Payer: Self-pay

## 2022-08-04 ENCOUNTER — Ambulatory Visit (HOSPITAL_COMMUNITY): Payer: BC Managed Care – PPO

## 2022-08-04 ENCOUNTER — Ambulatory Visit (HOSPITAL_BASED_OUTPATIENT_CLINIC_OR_DEPARTMENT_OTHER)
Admission: RE | Admit: 2022-08-04 | Discharge: 2022-08-04 | Disposition: A | Discharge status: Home / Self Care | Payer: BC Managed Care – PPO | Attending: Urology | Admitting: Urology

## 2022-08-04 DIAGNOSIS — N3020 Other chronic cystitis without hematuria: Secondary | ICD-10-CM | POA: Insufficient documentation

## 2022-08-04 DIAGNOSIS — N200 Calculus of kidney: Secondary | ICD-10-CM | POA: Insufficient documentation

## 2022-08-04 DIAGNOSIS — Z6834 Body mass index (BMI) 34.0-34.9, adult: Secondary | ICD-10-CM | POA: Insufficient documentation

## 2022-08-04 DIAGNOSIS — J45909 Unspecified asthma, uncomplicated: Secondary | ICD-10-CM | POA: Diagnosis not present

## 2022-08-04 DIAGNOSIS — G4733 Obstructive sleep apnea (adult) (pediatric): Secondary | ICD-10-CM | POA: Diagnosis not present

## 2022-08-04 DIAGNOSIS — I10 Essential (primary) hypertension: Secondary | ICD-10-CM | POA: Diagnosis not present

## 2022-08-04 DIAGNOSIS — Z87440 Personal history of urinary (tract) infections: Secondary | ICD-10-CM | POA: Diagnosis not present

## 2022-08-04 DIAGNOSIS — Z780 Asymptomatic menopausal state: Secondary | ICD-10-CM | POA: Diagnosis not present

## 2022-08-04 DIAGNOSIS — E669 Obesity, unspecified: Secondary | ICD-10-CM | POA: Diagnosis not present

## 2022-08-04 HISTORY — PX: EXTRACORPOREAL SHOCK WAVE LITHOTRIPSY: SHX1557

## 2022-08-04 SURGERY — LITHOTRIPSY, ESWL
Anesthesia: LOCAL | Laterality: Left

## 2022-08-04 MED ORDER — DIAZEPAM 5 MG PO TABS
ORAL_TABLET | ORAL | Status: AC
  Filled 2022-08-04: qty 2

## 2022-08-04 MED ORDER — SULFAMETHOXAZOLE-TRIMETHOPRIM 800-160 MG PO TABS: 1.0000 | ORAL_TABLET | Freq: Two times a day (BID) | ORAL | 0 refills | Status: DC

## 2022-08-04 MED ORDER — DIAZEPAM 5 MG PO TABS
10.0000 mg | ORAL_TABLET | ORAL | Status: AC
  Administered 2022-08-04: 10 mg via ORAL

## 2022-08-04 MED ORDER — CIPROFLOXACIN HCL 500 MG PO TABS
500.0000 mg | ORAL_TABLET | ORAL | Status: AC
  Administered 2022-08-04: 500 mg via ORAL

## 2022-08-04 MED ORDER — OXYCODONE-ACETAMINOPHEN 5-325 MG PO TABS: 1.0000 | ORAL_TABLET | Freq: Four times a day (QID) | ORAL | 0 refills | Status: AC | PRN

## 2022-08-04 MED ORDER — SODIUM CHLORIDE 0.9% FLUSH: 3.0000 mL | Freq: Two times a day (BID) | INTRAVENOUS | Status: DC

## 2022-08-04 MED ORDER — CIPROFLOXACIN HCL 500 MG PO TABS
ORAL_TABLET | ORAL | Status: AC
  Filled 2022-08-04: qty 1

## 2022-08-04 MED ORDER — SODIUM CHLORIDE 0.9 % IV SOLN: INTRAVENOUS | Status: DC

## 2022-08-04 MED ORDER — DIPHENHYDRAMINE HCL 25 MG PO CAPS
25.0000 mg | ORAL_CAPSULE | ORAL | Status: AC
  Administered 2022-08-04: 25 mg via ORAL

## 2022-08-04 MED ORDER — DIPHENHYDRAMINE HCL 25 MG PO CAPS
ORAL_CAPSULE | ORAL | Status: AC
  Filled 2022-08-04: qty 1

## 2022-08-04 NOTE — Op Note (Signed)
See [facility] scanned note

## 2022-08-04 NOTE — Interval H&P Note (Signed)
History and Physical Interval Note:  No change in stone  08/04/2022 7:27 AM  [name]  has presented today for surgery, with the diagnosis of RENAL CALCULUS.  The various methods of treatment have been discussed with the patient and family. After consideration of risks, benefits and other options for treatment, the patient has consented to  Procedure(s): EXTRACORPOREAL SHOCK WAVE LITHOTRIPSY (ESWL) (Left) as a surgical intervention.  The patient's history has been reviewed, patient examined, no change in status, stable for surgery.  I have reviewed the patient's chart and labs.  Questions were answered to the patient's satisfaction.     Irine Seal

## 2022-08-07 ENCOUNTER — Encounter (HOSPITAL_BASED_OUTPATIENT_CLINIC_OR_DEPARTMENT_OTHER): Payer: Self-pay | Admitting: Urology

## 2022-11-25 ENCOUNTER — Other Ambulatory Visit: Payer: Self-pay | Admitting: Internal Medicine

## 2023-02-19 ENCOUNTER — Encounter (INDEPENDENT_AMBULATORY_CARE_PROVIDER_SITE_OTHER): Payer: BC Managed Care – PPO | Admitting: Ophthalmology

## 2023-04-16 ENCOUNTER — Encounter (INDEPENDENT_AMBULATORY_CARE_PROVIDER_SITE_OTHER): Payer: BC Managed Care – PPO | Admitting: Ophthalmology

## 2023-05-22 ENCOUNTER — Other Ambulatory Visit: Payer: Self-pay | Admitting: Internal Medicine

## 2023-07-09 NOTE — Progress Notes (Signed)
 [facility] - Clinic Note  07/11/2023     CHIEF COMPLAINT Patient presents for Retina Follow Up s/p pneumatic cryopexy OD 12/04/17  HISTORY OF PRESENT ILLNESS: [name] is a 63 y.o. female who presents to the clinic today for:   HPI     Retina Follow Up   Patient presents with  Other.  In both eyes.  This started years ago.  Duration of 1.5 years.  Since onset it is stable.  I, the attending physician,  performed the HPI with the patient and updated documentation appropriately.        Comments   Patient feels the vision is stable. She needs to use readers a little more. She is using Pataday OU PRN.      Last edited by Ronelle Coffee, MD on 07/14/2023 12:26 PM.     Pt states vision is stable but is using readers more.  Referring physician: Azell Leopard, PA-C [address] [place],  [place] [address]  HISTORICAL INFORMATION:   Selected notes from the MEDICAL RECORD NUMBER Referred by Dr. Augustus Ledger for [address] OD;  LEE- 06.10.19 (JDM) [BCVA OD: 20/LP OD: 20/20]  Ocular Hx- pseudophakia OS (05/2013), cataract OD, [address] RD - break within lattice, HTN ret OU, vitreous opacities OU PMH- arthritis, sleep apnea, ulcerative colitis     CURRENT MEDICATIONS: No current outpatient medications on file. (Ophthalmic Drugs)   No current facility-administered medications for this visit. (Ophthalmic Drugs)   Current Outpatient Medications (Other)  Medication Sig   albuterol (PROVENTIL HFA;VENTOLIN HFA) 108 (90 BASE) MCG/ACT inhaler Inhale 2 puffs into the lungs every 6 (six) hours as needed.    Ascorbic Acid (VITAMIN C PO) Take by mouth. 750 mg daily   balsalazide (COLAZAL ) 750 MG capsule TAKE THREE CAPSULES BY MOUTH THREE TIMES A DAY   BIOTIN PO Take by mouth.   Calcium Carb-Cholecalciferol (CALCIUM + D3 PO) Take 2 tablets by mouth daily.   estradiol  (ESTRACE ) 0.1 MG/GM vaginal cream Place 0.5g nightly for two weeks then twice a week after    fluticasone (FLONASE) 50 MCG/ACT nasal spray Place 1 spray into both nostrils daily.   Fluticasone-Salmeterol (ADVAIR) 100-50 MCG/DOSE AEPB Inhale 1 puff into the lungs 2 (two) times daily.     loratadine (CLARITIN REDITABS) 10 MG dissolvable tablet Take 10 mg by mouth daily.     montelukast (SINGULAIR) 10 MG tablet Take 10 mg by mouth at bedtime.     Multiple Vitamin (MULTIVITAMIN) tablet Take 1 tablet by mouth daily.     nebivolol (BYSTOLIC) 2.5 MG tablet Take 2.5 mg by mouth daily.   Nutritional Supplements (ESTROVEN PO) Take by mouth.   phentermine (ADIPEX-P) 37.5 MG tablet Take 37.5 mg by mouth daily.   Probiotic Product (PROBIOTIC-10 PO) Take by mouth.   Simethicone 125 MG CAPS Take by mouth as needed.     bisacodyl (DULCOLAX) 5 MG EC tablet Take 5 mg by mouth daily as needed for moderate constipation. For a total of 8 for a 2 day colon prep   FLAX OIL-FISH OIL-BORAGE OIL PO Take by mouth.   oxyCODONE -acetaminophen  (PERCOCET) 5-325 MG tablet Take 1-2 tablets by mouth every 6 (six) hours as needed for severe pain.   sulfamethoxazole -trimethoprim  (BACTRIM  DS) 800-160 MG tablet Take 1 tablet by mouth 2 (two) times daily.   No current facility-administered medications for this visit. (Other)   REVIEW OF SYSTEMS: ROS   Positive for: Gastrointestinal, Skin, Musculoskeletal, Eyes, Respiratory Negative for: Constitutional,  Neurological, Genitourinary, HENT, Endocrine, Cardiovascular, Psychiatric, Allergic/Imm, Heme/Lymph Last edited by Olene Berne, COT on 07/11/2023  8:42 AM.      ALLERGIES Allergies  Allergen Reactions   Cefaclor Rash   PAST MEDICAL HISTORY Past Medical History:  Diagnosis Date   Allergic rhinitis    Allergy    Anemia    iron deficient   Anxiety    Arthritis    Asthma    inhaler   Cataract    left removed, right removed    Complication of anesthesia    Depression    Eczema    History of iron deficiency anemia    Hx of adenomatous polyp of colon  06/05/2012   2013 single adenoma - repeat colonoscopy 2016-17   Hypertension    PONV (postoperative nausea and vomiting)    Retinal detachment    Sleep apnea    wears c-pap   Ulcerative colitis, left sided (HCC) 2003   Past Surgical History:  Procedure Laterality Date   CARPAL TUNNEL RELEASE  2017   bilateral   CATARACT EXTRACTION Bilateral 05/2013   COLONOSCOPY  11/10/2008   ulcerative colitis   COLONOSCOPY     EXTRACORPOREAL SHOCK WAVE LITHOTRIPSY Right 09/22/2021   Procedure: EXTRACORPOREAL SHOCK WAVE LITHOTRIPSY (ESWL);  Surgeon: Erman Hayward, MD;  Location: [facility];  Service: Urology;  Laterality: Right;   EXTRACORPOREAL SHOCK WAVE LITHOTRIPSY Left 08/04/2022   Procedure: EXTRACORPOREAL SHOCK WAVE LITHOTRIPSY (ESWL);  Surgeon: Homero Luster, MD;  Location[facility];  Service: Urology;  Laterality: Left;   EYE SURGERY     pneumatic retinopexy Right 12/04/2017   bubble is gone per pt   POLYPECTOMY     TONSILLECTOMY AND ADENOIDECTOMY  1966   FAMILY HISTORY Family History  Problem Relation Age of Onset   Colon polyps Father    Diabetes Father    Heart disease Mother    Breast cancer Maternal Grandmother    Clotting disorder Daughter 24       deceased, blood clot   Colon cancer Neg Hx    Esophageal cancer Neg Hx    Rectal cancer Neg Hx    Stomach cancer Neg Hx    SOCIAL HISTORY Social History   Tobacco Use   Smoking status: Never   Smokeless tobacco: Never  Vaping Use   Vaping status: Never Used  Substance Use Topics   Alcohol use: Yes    Alcohol/week: 1.0 standard drink of alcohol    Types: 1 Standard drinks or equivalent per week    Comment: occ   Drug use: No       OPHTHALMIC EXAM:  Base Eye Exam     Visual Acuity (Snellen - Linear)       Right Left   Dist [place] 20/40 20/20   Dist ph [place] 20/25          Tonometry (Tonopen, 8:45 AM)       Right Left   Pressure 15 17         Pupils       Dark Light Shape  React APD   Right 5 3 Round Brisk None   Left 5 3 Round Brisk None         Visual Fields       Left Right    Full Full         Extraocular Movement       Right Left    Full, Ortho Full, Ortho  Neuro/Psych     Oriented x3: Yes   Mood/Affect: Normal         Dilation     Both eyes: 1.0% Mydriacyl, 2.5% Phenylephrine @ 8:42 AM           Slit Lamp and Fundus Exam     Slit Lamp Exam       Right Left   Lids/Lashes Dermatochalasis - upper lid, mild Meibomian gland dysfunction Dermatochalasis - upper lid, mild Telangiectasia, mild Meibomian gland dysfunction   Conjunctiva/Sclera White and quiet White and quiet   Cornea Arcus, 1-2+ Punctate epithelial erosions, mild tear film debris, trace fine endopigment, well healed temporal cataract wounds Arcus, trace fine Punctate epithelial erosions, trace Debris in tear film, trace Endothelium Pigment   Anterior Chamber Deep and quiet, open angles  Deep and quiet   Iris Round and dilated Round and dilated   Lens PCIOL; trace Posterior capsular opacification inferiorly PC IOL in good position, 1+ Posterior capsular opacification - non-central   Anterior Vitreous Vitreous syneresis, mild residual pigment in anterior vitreous Vitreous syneresis         Fundus Exam       Right Left   Disc Pink and Sharp, Compact, focal temp PPP Pink and Sharp, Compact   C/D Ratio 0.3 0.2   Macula Flat, good foveal reflex, Retinal pigment epithelial mottling, No heme or edema, trace ERM Flat, Good foveal reflex, mild Retinal pigment epithelial, mottling, mild Epiretinal membrane with striae nasal macula, No heme or edema   Vessels attenuated, Tortuous mild attenuation, mild tortuosity   Periphery Retina reattached, 2 retinal tears at 1030 in same linear meridian, good cryo and laser changes surrounding tears, no new RT/RD, No heme Attached, lattice degeneration at 1200 almost to ora, lattice at 0500 ora -- good laser surrounding both  lesions, no new RT/RD/Lattice           IMAGING AND PROCEDURES  Imaging and Procedures for @TODAY @  OCT, Retina - OU - Both Eyes       Right Eye Quality was good. Central Foveal Thickness: 286. Progression has been stable. Findings include normal foveal contour, no IRF, no SRF.   Left Eye Quality was good. Central Foveal Thickness: 299. Progression has been stable. Findings include normal foveal contour, no IRF, no SRF, epiretinal membrane, macular pucker (Stable ERM/pucker nasal macula).   Notes *Images captured and stored on drive  Diagnosis / Impression:  NFP; no IRF/SRF OU OS: Stable ERM/pucker nasal macula  Clinical management:  See below  Abbreviations: NFP - Normal foveal profile. CME - cystoid macular edema. PED - pigment epithelial detachment. IRF - intraretinal fluid. SRF - subretinal fluid. EZ - ellipsoid zone. ERM - epiretinal membrane. ORA - outer retinal atrophy. ORT - outer retinal tubulation. SRHM - subretinal hyper-reflective material              ASSESSMENT/PLAN:    ICD-10-CM   1. 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 Retinal hole of left eye  H33.322     4. Epiretinal membrane (ERM) of both eyes  H35.373 OCT, Retina - OU - Both Eyes    5. Essential hypertension  I10     6. Hypertensive retinopathy of both eyes  H35.033     7. Pseudophakia, both eyes  Z96.1     8. Bilateral dry eyes  H04.123      1. Macula-involving rhegmatogenous retinal detachment, OD-  -  RD from 0900 to 0100 with SRF tracking into macula  - 2 tears at 1030 meridian   - s/p pneumatic cryopexy OD (6.11.19)   - s/p touch up laser retinopexy OD (06.14.19)  - BCVA  20/25  - IOP okay at 15  - F/U 1 yr, sooner prn -- DFE/OCT  2,3. Lattice degeneration w/ atrophic holes, OU  - lattice at 1230 and 5 oclock ora OS  - S/P laser retinopexy OS (07.24.19) -- good laser surounding  - no new RT/RD  - Reviewed s/s of  RT/RD  - Strict return precautions for any such RT/RD symptoms   4. Epiretinal membrane, OU, (OS>OD)  - trace ERM OD - mild ERM w/ pucker OS -- stable - BCVA 20/20 OS - asymptomatic, no metamorphopsia - no indication for surgery at this time - monitor for now - f/u 1 year -- DFE/OCT   5,6. Hypertensive retinopathy OU  - discussed importance of tight BP control  - monitor   7. Pseudophakia OU  - s/p CE/IOL OU [facility])  - beautiful surgeries, doing well  - monitor   8. Dry eyes OU - recommend artificial tears and lubricating ointment as needed   Ophthalmic Meds Ordered this visit:  No orders of the defined types were placed in this encounter.    Return in about 1 year (around 07/10/2024) for f/u RD OD, DFE, OCT.  There are no Patient Instructions on file for this visit.   Explained the diagnoses, plan, and follow up with the patient and they expressed understanding.  Patient expressed understanding of the importance of proper follow up care.   This document serves as a record of services personally performed by Jeanice Millard, MD, PhD. It was created on their behalf by Morley Arabia. Bevin Bucks, OA an ophthalmic technician. The creation of this record is the provider's dictation and/or activities during the visit.    Electronically signed by: Morley Arabia. Bevin Bucks, OA 07/14/23 12:29 PM  This document serves as a record of services personally performed by Jeanice Millard, MD, PhD. It was created on their behalf by Olene Berne, COT an ophthalmic technician. The creation of this record is the provider's dictation and/or activities during the visit.    Electronically signed by:  Olene Berne, COT  07/14/23 12:29 PM  Jeanice Millard, M.D., Ph.D. Diseases & Surgery of the Retina and Vitreous [facility]  I have reviewed the above documentation for accuracy and completeness, and I agree with the above. Jeanice Millard, M.D., Ph.D.  07/14/23 12:30 PM   Abbreviations: M myopia (nearsighted); A astigmatism; H hyperopia (farsighted); P presbyopia; Mrx spectacle prescription;  CTL contact lenses; OD right eye; OS left eye; OU both eyes  XT exotropia; ET esotropia; PEK punctate epithelial keratitis; PEE punctate epithelial erosions; DES dry eye syndrome; MGD meibomian gland dysfunction; ATs artificial tears; PFAT's preservative free artificial tears; NSC nuclear sclerotic cataract; PSC posterior subcapsular cataract; ERM epi-retinal membrane; PVD posterior vitreous detachment; RD retinal detachment; DM diabetes mellitus; DR diabetic retinopathy; NPDR non-proliferative diabetic retinopathy; PDR proliferative diabetic retinopathy; CSME clinically significant macular edema; DME diabetic macular edema; dbh dot blot hemorrhages; CWS cotton wool spot; POAG primary open angle glaucoma; C/D cup-to-disc ratio; HVF humphrey visual field; GVF goldmann visual field; OCT optical coherence tomography; IOP intraocular pressure; BRVO Branch retinal vein occlusion; CRVO central retinal vein occlusion; CRAO central retinal artery occlusion; BRAO branch retinal artery occlusion; RT retinal tear;  SB scleral buckle; PPV pars plana vitrectomy; VH Vitreous hemorrhage; PRP panretinal laser photocoagulation; IVK intravitreal kenalog; VMT vitreomacular traction; MH Macular hole;  NVD neovascularization of the disc; NVE neovascularization elsewhere; AREDS age related eye disease study; ARMD age related macular degeneration; POAG primary open angle glaucoma; EBMD epithelial/anterior basement membrane dystrophy; ACIOL anterior chamber intraocular lens; IOL intraocular lens; PCIOL posterior chamber intraocular lens; Phaco/IOL phacoemulsification with intraocular lens placement; PRK photorefractive keratectomy; LASIK laser assisted in situ keratomileusis; HTN hypertension; DM diabetes mellitus; COPD chronic obstructive pulmonary disease

## 2023-07-11 ENCOUNTER — Ambulatory Visit (INDEPENDENT_AMBULATORY_CARE_PROVIDER_SITE_OTHER): Payer: BC Managed Care – PPO | Admitting: Ophthalmology

## 2023-07-11 ENCOUNTER — Encounter (INDEPENDENT_AMBULATORY_CARE_PROVIDER_SITE_OTHER): Payer: Self-pay | Admitting: Ophthalmology

## 2023-07-11 DIAGNOSIS — H35033 Hypertensive retinopathy, bilateral: Secondary | ICD-10-CM | POA: Diagnosis not present

## 2023-07-11 DIAGNOSIS — H35373 Puckering of macula, bilateral: Secondary | ICD-10-CM

## 2023-07-11 DIAGNOSIS — I10 Essential (primary) hypertension: Secondary | ICD-10-CM | POA: Diagnosis not present

## 2023-07-11 DIAGNOSIS — H35413 Lattice degeneration of retina, bilateral: Secondary | ICD-10-CM | POA: Diagnosis not present

## 2023-07-11 DIAGNOSIS — H33322 Round hole, left eye: Secondary | ICD-10-CM

## 2023-07-11 DIAGNOSIS — H3321 Serous retinal detachment, right eye: Secondary | ICD-10-CM

## 2023-07-11 DIAGNOSIS — H04123 Dry eye syndrome of bilateral lacrimal glands: Secondary | ICD-10-CM

## 2023-07-11 DIAGNOSIS — Z961 Presence of intraocular lens: Secondary | ICD-10-CM

## 2023-07-11 DIAGNOSIS — H35372 Puckering of macula, left eye: Secondary | ICD-10-CM

## 2023-08-08 ENCOUNTER — Encounter: Payer: Self-pay | Admitting: Internal Medicine

## 2023-08-20 ENCOUNTER — Telehealth: Payer: Self-pay

## 2023-08-20 ENCOUNTER — Other Ambulatory Visit: Payer: Self-pay | Admitting: Internal Medicine

## 2023-08-20 NOTE — Telephone Encounter (Signed)
 I reached out to [facility] because we got a refill request on her Balsalazide and she needed an office visit. Last seen 07/2021. She has a colon recall in epic for 07/2023. She said she hasn't received a letter yet. She is concerned she has an anal fissure. 4 weeks ago it started bothering her, pain and looks like a tear. Back in 2016 she had one and was given Diltiazem gel. She wonders if we could send that in or do you need to see her first. If so would you like me to move her May 22 appointment up. She is aware Dr Leone Payor is out of the office this week and said she is fine to wait. She is currently using recti care on it.

## 2023-08-24 ENCOUNTER — Encounter: Payer: Self-pay | Admitting: Internal Medicine

## 2023-08-26 NOTE — Telephone Encounter (Signed)
 It is okay to send in a prescription for diltiazem 2% lidocaine 5% one-to-one mix number 30 g with 2 refills  Directions would be to apply into anal canal twice daily  will have to ask her what pharmacy she uses  See if you can find her a work in spot like an 11:30 AM or 3:50 PM slot somewhere later this month or early April

## 2023-08-26 NOTE — Telephone Encounter (Signed)
 Correction-I have reviewed patient [name] message and she is improving and wants to schedule her colonoscopy so lets just go ahead and do that as outlined in the patient [name] message

## 2023-08-27 ENCOUNTER — Encounter: Payer: Self-pay | Admitting: Internal Medicine

## 2023-08-27 NOTE — Telephone Encounter (Signed)
 I cancelled her 11/15/2023 appointment. We will await her to call and set up pre-visit/colonoscopy appointments.

## 2023-08-28 NOTE — Telephone Encounter (Signed)
 [name] called and set up her pre-op and colonoscopy appointments.

## 2023-09-05 ENCOUNTER — Ambulatory Visit (AMBULATORY_SURGERY_CENTER)

## 2023-09-05 VITALS — Ht 65.0 in | Wt 180.0 lb

## 2023-09-05 DIAGNOSIS — K5150 Left sided colitis without complications: Secondary | ICD-10-CM

## 2023-09-05 NOTE — Progress Notes (Signed)
 No egg or soy allergy known to patient  No issues known to pt with past sedation with any surgeries or procedures Patient denies ever being told they had issues or difficulty with intubation  No FH of Malignant Hyperthermia Pt is on diet pills yes phentermine will hold 10 days Pt is not on  home 02  Pt is not on blood thinners  Pt has some constipation and takes stool softner No A fib or A flutter Have any cardiac testing pending--no Pt can ambulate independently Pt denies use of chewing tobacco Discussed diabetic I weight loss medication holds Discussed NSAID holds Checked BMI Pt instructed to use [employer] or GoodRx for a price reduction on prep  Patient's chart reviewed by Cathlyn Parsons CNRA prior to previsit and patient appropriate for the [facility].  Pre visit completed and red dot placed by patient's name on their procedure day (on provider's schedule).

## 2023-09-12 IMAGING — CT CT RENAL STONE PROTOCOL
2 of 4 series · 16 of 46 positions shown, 18 images · non-contrast
Comparison: None.

CLINICAL DATA: Nephrolithiasis.



[Series 2: stone full · axial · 0.82mm/px · z∈[+937,+1337]mm · 13 of 88 slices shown, 15 images]
[im 4/88  soft-tissue]
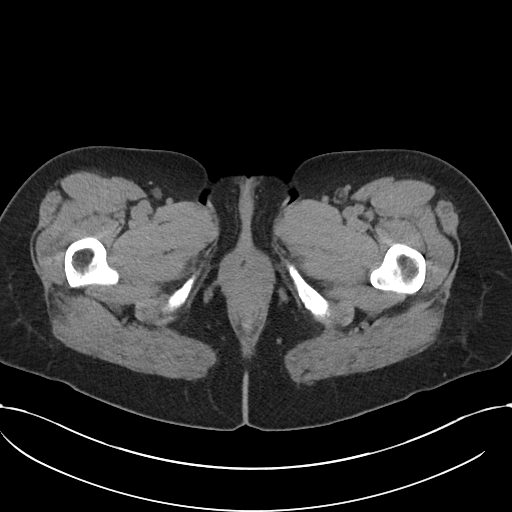
[im 4/88  bone]
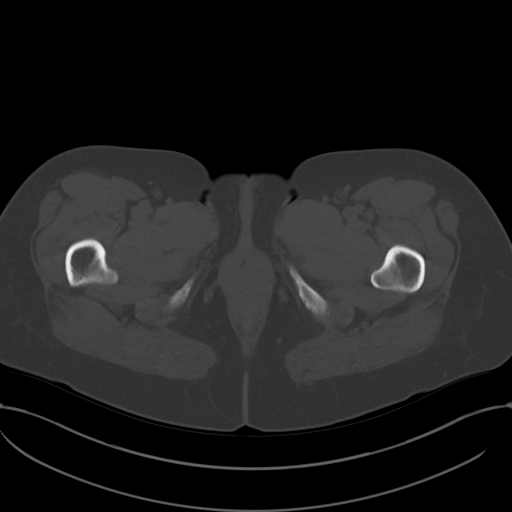
[im 11/88  soft-tissue]
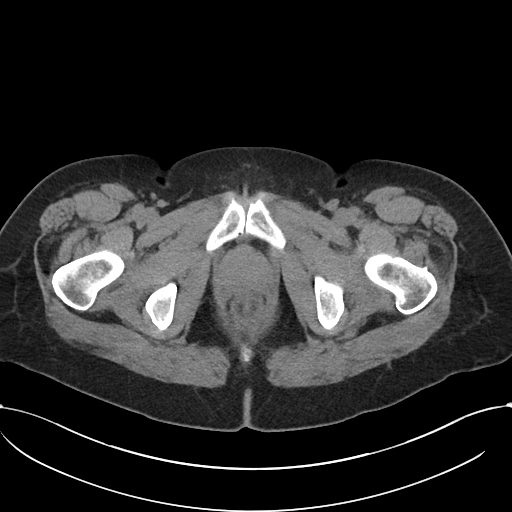
[im 18/88  soft-tissue]
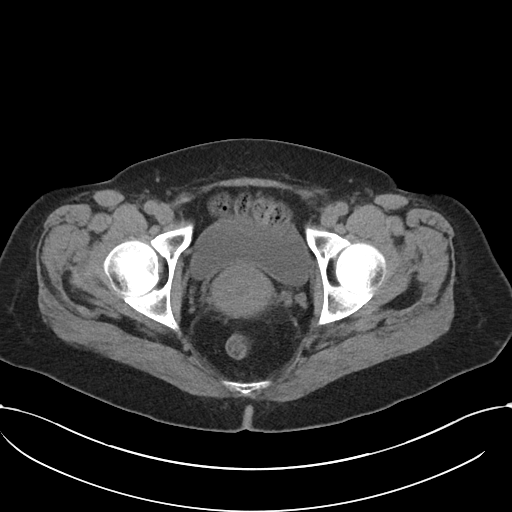
[im 25/88  soft-tissue]
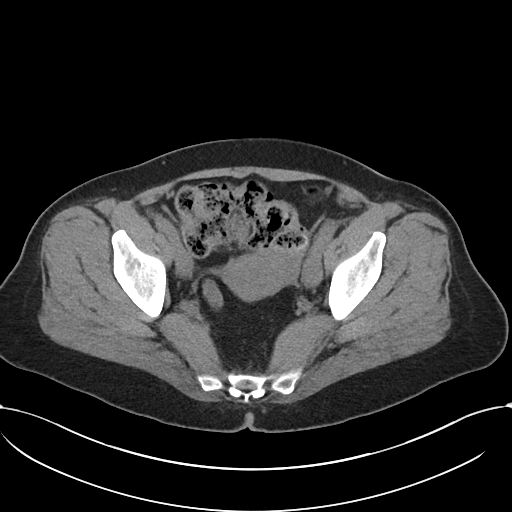
[im 32/88  soft-tissue]
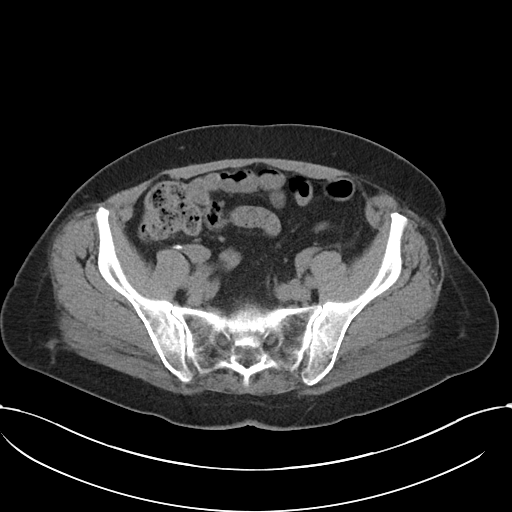
[im 39/88  soft-tissue]
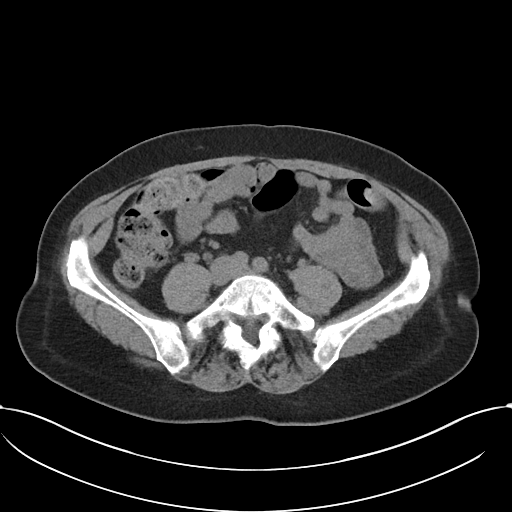
[im 46/88  soft-tissue]
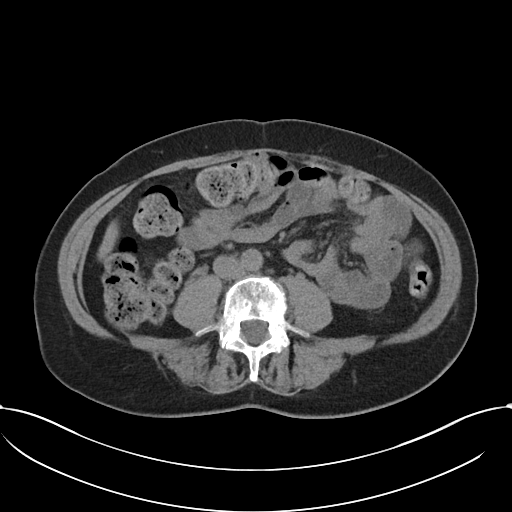
[im 49/88  soft-tissue]
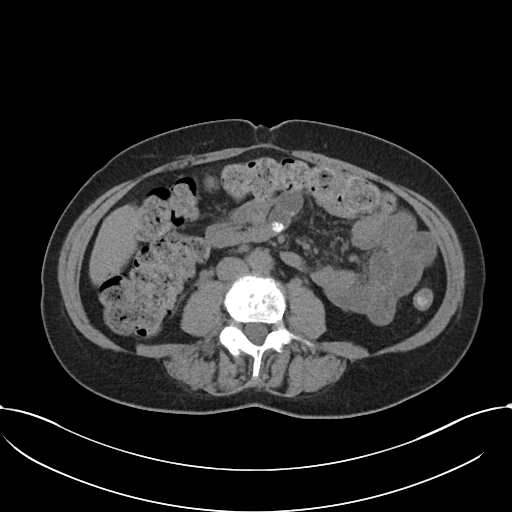
[im 56/88  soft-tissue]
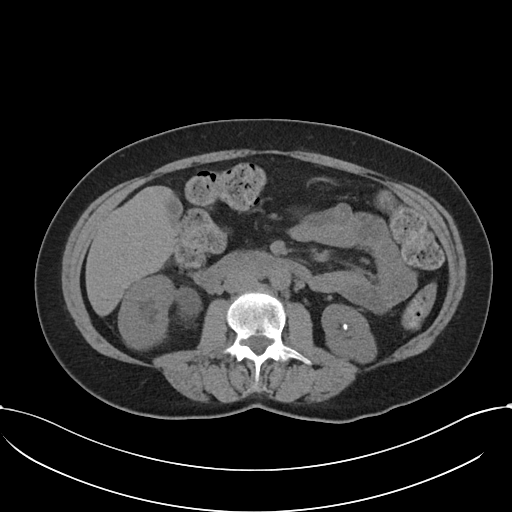
[im 56/88  bone]
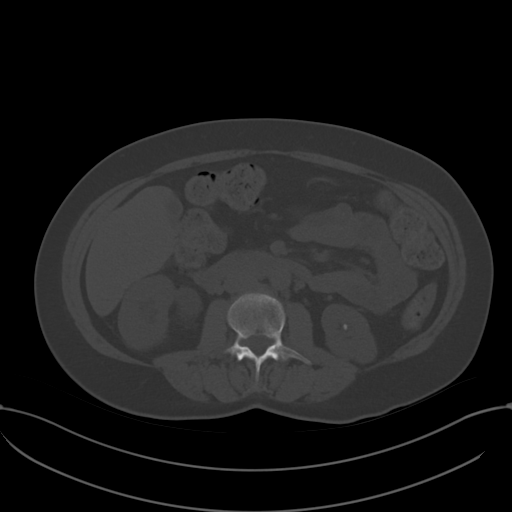
[im 63/88  soft-tissue]
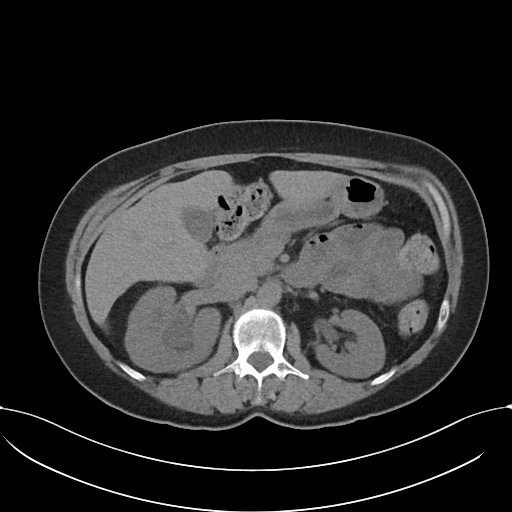
[im 70/88  soft-tissue]
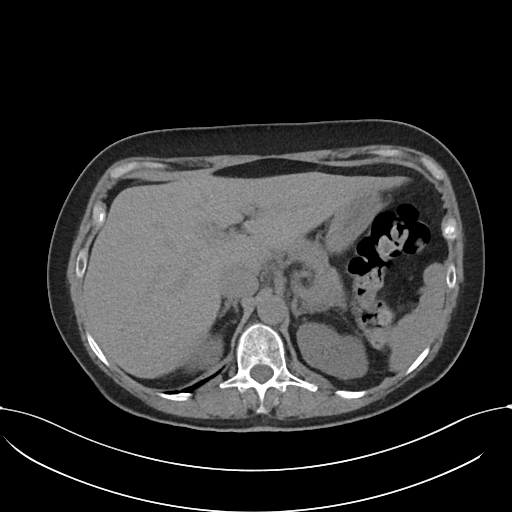
[im 77/88  soft-tissue]
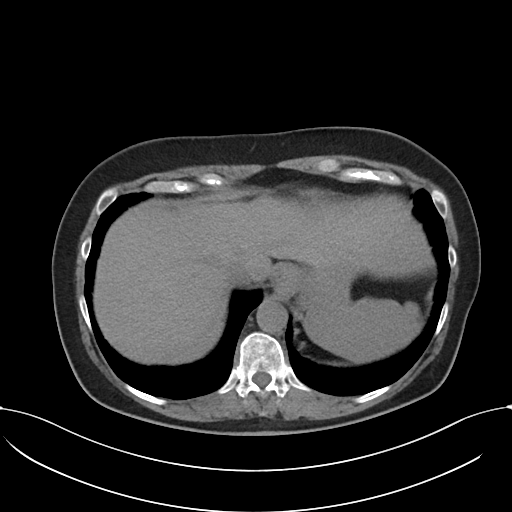
[im 84/88  soft-tissue]
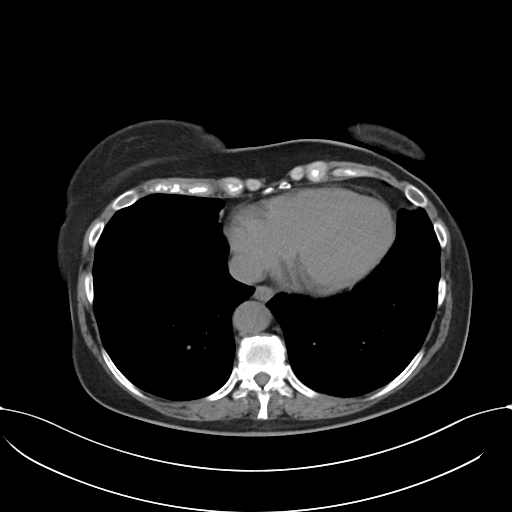

[Series 5: coronal · coronal · 0.89mm/px · 3 of 94 slices shown]
[im 32/94  soft-tissue]
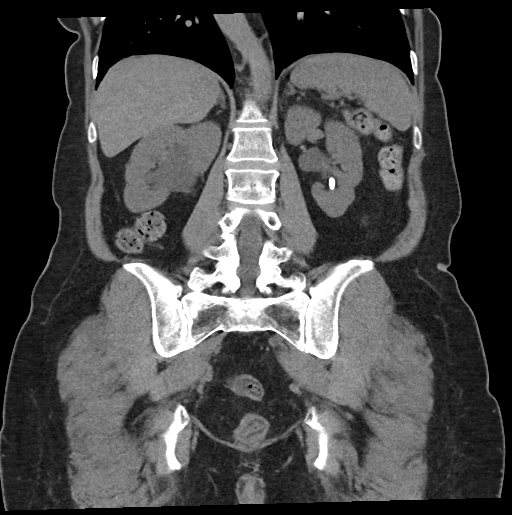
[im 42/94  soft-tissue]
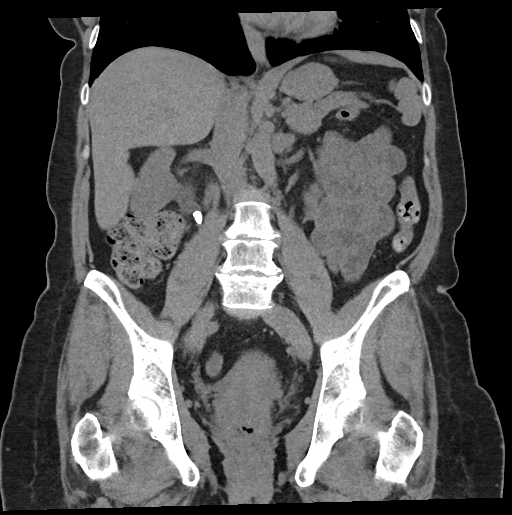
[im 52/94  soft-tissue]
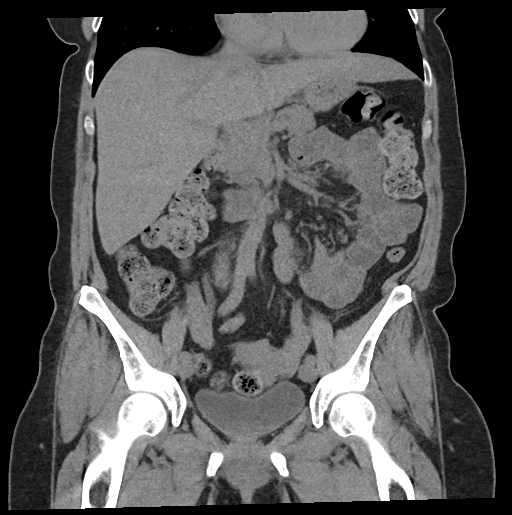

[16 of 46 positions shown; findings below may reference images not displayed]

FINDINGS: Lower chest: No acute abnormality.

Hepatobiliary: No focal liver abnormality is seen. No gallstones,
gallbladder wall thickening, or biliary dilatation.

Pancreas: Unremarkable. No pancreatic ductal dilatation or
surrounding inflammatory changes.

Spleen: Normal in size without focal abnormality.

Adrenals/Urinary Tract: Normal adrenal glands. Moderate right
hydronephrosis and proximal hydroureter caused by obstructive 1 cm
calculus just distal to the UPJ. Large nonobstructive left renal
calculus in the lower pole measures 1 cm. Two few mm nonobstructive
calculi also seen in the lower pole of the left kidney.

Stomach/Bowel: Stomach is within normal limits. No evidence of
appendicitis. No evidence of bowel wall thickening, distention, or
inflammatory changes.

Vascular/Lymphatic: No significant vascular findings are present. No
enlarged abdominal or pelvic lymph nodes.

Reproductive: Uterus and bilateral adnexa are unremarkable.

Other: No abdominal wall hernia or abnormality. No abdominopelvic
ascites.

Musculoskeletal: Spondylosis at L3-L4 and L4-L5. Bilateral pars
articularis defects at L3-L4. Bilateral pars articularis defects at
L5-S1.
IMPRESSION: 1. Moderate right hydronephrosis and proximal hydroureter caused by
obstructive 1 cm calculus just distal to the UPJ.
2. Nonobstructive left nephrolithiasis.
3. Spondylosis at L3-L4 and L4-L5. Bilateral pars articularis
defects at L3-L4 and L5-S1.

## 2023-09-28 ENCOUNTER — Encounter: Payer: Self-pay | Admitting: Internal Medicine

## 2023-10-01 NOTE — Progress Notes (Unsigned)
 [facility] History and Physical   Primary Care Physician:  Marrian Salvage, PA-C   Reason for Procedure:  Follow-up of left-sided ulcerative colitis  Plan:    Colonoscopy     HPI: [name] is a 63 y.o. female with chronic left-sided ulcerative colitis maintained on balsalazide, with last colonoscopy in 2023 demonstrating minimal inflammation in the distal colon and rectum as outlined on pathology below.  She has a redundant colon and requires an adult scope and abdominal binder. C/o anal protrusion w/ soreness past few weeks  1. Surgical [P], colon, transverse, ascending, and cecum - UNREMARKABLE COLONIC MUCOSA. - NO ACTIVE INFLAMMATION OR GRANULOMAS. - NEGATIVE FOR DYSPLASIA. 2. Surgical [P], colon, descending and proximal sigmoid - UNREMARKABLE COLONIC MUCOSA. - NO ACTIVE INFLAMMATION OR GRANULOMAS. - NEGATIVE FOR DYSPLASIA. 3. Surgical [P], colon, rectum and distal sigmoid - MINIMALLY ACTIVE CHRONIC COLITIS. - NEGATIVE FOR DYSPLASIA Past Medical History:  Diagnosis Date   Allergic rhinitis    Allergy    Anemia    iron deficient   Anxiety    Arthritis    Asthma    inhaler   Cataract    left removed, right removed    Complication of anesthesia    Depression    Eczema    History of iron deficiency anemia    Hx of adenomatous polyp of colon 06/05/2012   2013 single adenoma - repeat colonoscopy 2016-17   Hypertension    PONV (postoperative nausea and vomiting)    Retinal detachment    Sleep apnea    wears c-pap   Ulcerative colitis, left sided (HCC) 2003    Past Surgical History:  Procedure Laterality Date   CARPAL TUNNEL RELEASE  2017   bilateral   CATARACT EXTRACTION Bilateral 05/2013   COLONOSCOPY  11/10/2008   ulcerative colitis   COLONOSCOPY     EXTRACORPOREAL SHOCK WAVE LITHOTRIPSY Right 09/22/2021   Procedure: EXTRACORPOREAL SHOCK WAVE LITHOTRIPSY (ESWL);  Surgeon: Alfredo Martinez, MD;  Location: [facility];   Service: Urology;  Laterality: Right;   EXTRACORPOREAL SHOCK WAVE LITHOTRIPSY Left 08/04/2022   Procedure: EXTRACORPOREAL SHOCK WAVE LITHOTRIPSY (ESWL);  Surgeon: Bjorn Pippin, MD;  Location: [facility];  Service: Urology;  Laterality: Left;   EYE SURGERY     pneumatic retinopexy Right 12/04/2017   bubble is gone per pt   POLYPECTOMY     TONSILLECTOMY AND ADENOIDECTOMY  1966    Prior to Admission medications   Medication Sig Start Date End Date Taking? Authorizing Provider  albuterol (PROVENTIL HFA;VENTOLIN HFA) 108 (90 BASE) MCG/ACT inhaler Inhale 2 puffs into the lungs every 6 (six) hours as needed.     [provider]  Ascorbic Acid (VITAMIN C PO) Take by mouth. 750 mg daily    [provider]  balsalazide (COLAZAL) 750 MG capsule TAKE THREE CAPSULES BY MOUTH THREE TIMES A DAY , please keep May appt 08/20/23   Iva Boop, MD  BIOTIN PO Take by mouth.    [provider]  Calcium Carb-Cholecalciferol (CALCIUM + D3 PO) Take 2 tablets by mouth daily.    [provider]  estradiol (ESTRACE) 0.1 MG/GM vaginal cream Place 0.5g nightly for two weeks then twice a week after 08/17/21   Marguerita Beards, MD  fexofenadine [facility] ALLERGY) 180 MG tablet Take 1 tablet every day by oral route.    [provider]  fluticasone (FLONASE) 50 MCG/ACT nasal spray Place 1 spray into both nostrils daily.  [provider]  Fluticasone-Salmeterol (ADVAIR) 100-50 MCG/DOSE AEPB Inhale 1 puff into the lungs 2 (two) times daily.      [provider]  loratadine (CLARITIN REDITABS) 10 MG dissolvable tablet Take 10 mg by mouth daily.      [provider]  montelukast (SINGULAIR) 10 MG tablet Take 10 mg by mouth at bedtime.      [provider]  Multiple Vitamin (MULTIVITAMIN) tablet Take 1 tablet by mouth daily.      [provider]  nebivolol (BYSTOLIC) 2.5 MG tablet Take 2.5 mg by mouth daily.    [provider]  Nutritional Supplements (ESTROVEN PO) Take by mouth.    [provider]  phentermine (ADIPEX-P) 37.5 MG tablet Take 37.5 mg by mouth daily. 12/09/17   [provider]  Probiotic Product (PROBIOTIC-10 PO) Take by mouth. Patient not taking: Reported on 09/05/2023    [provider]  Simethicone 125 MG CAPS Take by mouth as needed.      [provider]    Current Outpatient Medications  Medication Sig Dispense Refill   Ascorbic Acid (VITAMIN C PO) Take by mouth. 750 mg daily     balsalazide (COLAZAL) 750 MG capsule TAKE THREE CAPSULES BY MOUTH THREE TIMES A DAY , please keep May appt 810 capsule 0   BIOTIN PO Take by mouth.     Calcium Carb-Cholecalciferol (CALCIUM + D3 PO) Take 2 tablets by mouth daily.     fexofenadine (ALLEGRA ALLERGY) 180 MG tablet Take 1 tablet every day by oral route.     fluticasone (FLONASE) 50 MCG/ACT nasal spray Place 1 spray into both nostrils daily.     Fluticasone-Salmeterol (ADVAIR) 100-50 MCG/DOSE AEPB Inhale 1 puff into the lungs 2 (two) times daily.       loratadine (CLARITIN REDITABS) 10 MG dissolvable tablet Take 10 mg by mouth daily.       montelukast (SINGULAIR) 10 MG tablet Take 10 mg by mouth at bedtime.       Multiple Vitamin (MULTIVITAMIN) tablet Take 1 tablet by mouth daily.       nebivolol (BYSTOLIC) 2.5 MG tablet Take 2.5 mg by mouth daily.     Nutritional Supplements (ESTROVEN PO) Take by mouth.     Probiotic Product (PROBIOTIC-10 PO) Take by mouth.     Simethicone 125 MG CAPS Take by mouth as needed.       albuterol (PROVENTIL HFA;VENTOLIN HFA) 108 (90 BASE) MCG/ACT inhaler Inhale 2 puffs into the lungs every 6 (six) hours as needed.      estradiol (ESTRACE) 0.1 MG/GM vaginal cream Place 0.5g nightly for two weeks then twice a week after 30 g 11   phentermine (ADIPEX-P) 37.5 MG tablet Take 37.5 mg by mouth daily.  1   Current Facility-Administered Medications  Medication Dose Route Frequency  Provider Last Rate Last Admin   0.9 %  sodium chloride infusion  500 mL Intravenous Continuous Iva Boop, MD        Allergies as of 10/02/2023 - Review Complete 10/02/2023  Allergen Reaction Noted   Cefaclor Rash     Family History  Problem Relation Age of Onset   Colon polyps Father    Diabetes Father    Heart disease Mother    Breast cancer Maternal Grandmother    Clotting disorder Daughter 24       deceased, blood clot   Colon cancer Neg Hx    Esophageal cancer Neg Hx  Rectal cancer Neg Hx    Stomach cancer Neg Hx     Social History   Socioeconomic History   Marital status: Married    Spouse name: Not on file   Number of children: 2   Years of education: Not on file   Highest education level: Not on file  Occupational History   Occupation: insurance agent  Tobacco Use   Smoking status: Never   Smokeless tobacco: Never  Vaping Use   Vaping status: Never Used  Substance and Sexual Activity   Alcohol use: Yes    Alcohol/week: 1.0 standard drink of alcohol    Types: 1 Standard drinks or equivalent per week    Comment: occ   Drug use: No   Sexual activity: Yes  Other Topics Concern   Not on file  Social History Narrative   Not on file   Social Drivers of Health   Financial Resource Strain: Not on file  Food Insecurity: Not on file  Transportation Needs: Not on file  Physical Activity: Not on file  Stress: Not on file  Social Connections: Not on file  Intimate Partner Violence: Not on file    Review of Systems:  All other review of systems negative except as mentioned in the HPI.  Physical Exam: Vital signs BP (!) 141/92   Pulse 70   Temp 97.9 F (36.6 C) (Temporal)   Ht 5\' 5"  (1.651 m)   Wt 180 lb (81.6 kg)   LMP 05/18/2014   SpO2 97%   BMI 29.95 kg/m   General:   Alert,  Well-developed, well-nourished, pleasant and cooperative in NAD Lungs:  Clear throughout to auscultation.   Heart:  Regular rate and rhythm; no murmurs, clicks,  rubs,  or gallops. Abdomen:  Soft, nontender and nondistended. Normal bowel sounds.   Neuro/Psych:  Alert and cooperative. Normal mood and affect. A and O x 3   @Cherith Tewell  Sena Slate, MD, [facility] [phone] (pager) 10/02/2023 7:48 AM@

## 2023-10-02 ENCOUNTER — Encounter: Payer: Self-pay | Admitting: Internal Medicine

## 2023-10-02 ENCOUNTER — Ambulatory Visit: Admitting: Internal Medicine

## 2023-10-02 VITALS — BP 107/78 | HR 68 | Temp 97.9°F | Resp 9 | Ht 65.0 in | Wt 180.0 lb

## 2023-10-02 DIAGNOSIS — K5150 Left sided colitis without complications: Secondary | ICD-10-CM

## 2023-10-02 DIAGNOSIS — Z1211 Encounter for screening for malignant neoplasm of colon: Secondary | ICD-10-CM

## 2023-10-02 DIAGNOSIS — K529 Noninfective gastroenteritis and colitis, unspecified: Secondary | ICD-10-CM

## 2023-10-02 DIAGNOSIS — K644 Residual hemorrhoidal skin tags: Secondary | ICD-10-CM

## 2023-10-02 MED ORDER — HYDROCORTISONE (PERIANAL) 2.5 % EX CREA: 1.0000 | TOPICAL_CREAM | Freq: Two times a day (BID) | CUTANEOUS | 1 refills | Status: AC | PRN

## 2023-10-02 MED ORDER — SODIUM CHLORIDE 0.9 % IV SOLN: 500.0000 mL | INTRAVENOUS | Status: DC

## 2023-10-02 NOTE — Progress Notes (Signed)
 Binder released at end of procedure.  Report to PACU, RN, vss, BBS= Clear.

## 2023-10-02 NOTE — Progress Notes (Signed)
 Called to room to assist during endoscopic procedure.  Patient ID and intended procedure confirmed with present staff. Received instructions for my participation in the procedure from the performing physician.

## 2023-10-02 NOTE — Progress Notes (Signed)
 Abd binder applied before sedation.  Pt stated no concerns

## 2023-10-02 NOTE — Patient Instructions (Addendum)
 Possible mild inflammation seen in the rectum and sigmoid colon. Biopsies taken as usual. \You have some swollen external hemorrhoids.  I have prescribed a cream to help.  I will let you know pathology results and when to have another routine colonoscopy by mail and/or My Chart.  I appreciate the opportunity to care for you. Iva Boop, MD, FACG  YOU HAD AN ENDOSCOPIC PROCEDURE TODAY AT THE [facility]:   Refer to the procedure report that was given to you for any specific questions about what was found during the examination.  If the procedure report does not answer your questions, please call your gastroenterologist to clarify.  If you requested that your care partner not be given the details of your procedure findings, then the procedure report has been included in a sealed envelope for you to review at your convenience later.  YOU SHOULD EXPECT: Some feelings of bloating in the abdomen. Passage of more gas than usual.  Walking can help get rid of the air that was put into your GI tract during the procedure and reduce the bloating. If you had a lower endoscopy (such as a colonoscopy or flexible sigmoidoscopy) you may notice spotting of blood in your stool or on the toilet paper. If you underwent a bowel prep for your procedure, you may not have a normal bowel movement for a few days.  Please Note:  You might notice some irritation and congestion in your nose or some drainage.  This is from the oxygen used during your procedure.  There is no need for concern and it should clear up in a day or so.  SYMPTOMS TO REPORT IMMEDIATELY:  Following lower endoscopy (colonoscopy or flexible sigmoidoscopy):  Excessive amounts of blood in the stool  Significant tenderness or worsening of abdominal pains  Swelling of the abdomen that is new, acute  Fever of 100F or higher   For urgent or emergent issues, a gastroenterologist can be reached at any hour by calling [phone]. Do  not use MyChart messaging for urgent concerns.    DIET:  We do recommend a small meal at first, but then you may proceed to your regular diet.  Drink plenty of fluids but you should avoid alcoholic beverages for 24 hours.  ACTIVITY:  You should plan to take it easy for the rest of today and you should NOT DRIVE or use heavy machinery until tomorrow (because of the sedation medicines used during the test).    FOLLOW UP: Our staff will call the number listed on your records the next business day following your procedure.  We will call around 7:15- 8:00 am to check on you and address any questions or concerns that you may have regarding the information given to you following your procedure. If we do not reach you, we will leave a message.     If any biopsies were taken you will be contacted by phone or by letter within the next 1-3 weeks.  Please call us at [phone] if you have not heard about the biopsies in 3 weeks.    SIGNATURES/CONFIDENTIALITY: You and/or your care partner have signed paperwork which will be entered into your electronic medical record.  These signatures attest to the fact that that the information above on your After Visit Summary has been reviewed and is understood.  Full responsibility of the confidentiality of this discharge information lies with you and/or your care-partner.

## 2023-10-02 NOTE — Op Note (Addendum)
 [facility] Patient Name: [name] Procedure Date: 10/02/2023 7:49 AM MRN: [id number] Endoscopist: Iva Boop , MD, [id number] Age: 63 Referring MD:  Date of Birth: 09/28/1960 Gender: Female Account #: [id number] Procedure:                Colonoscopy Indications:              High risk colon cancer surveillance: Ulcerative                            left sided colitis of 8 (or more) years duration,                            Last colonoscopy: February 2023 Patient on                            balsalazide 2.25g tid Medicines:                Monitored Anesthesia Care Procedure:                Pre-Anesthesia Assessment:                           - Prior to the procedure, a History and Physical                            was performed, and patient medications and                            allergies were reviewed. The patient's tolerance of                            previous anesthesia was also reviewed. The risks                            and benefits of the procedure and the sedation                            options and risks were discussed with the patient.                            All questions were answered, and informed consent                            was obtained. Prior Anticoagulants: The patient has                            taken no anticoagulant or antiplatelet agents. ASA                            Grade Assessment: II - A patient with mild systemic                            disease. After reviewing the risks and benefits,  the patient was deemed in satisfactory condition to                            undergo the procedure.                           After obtaining informed consent, the colonoscope                            was passed under direct vision. Throughout the                            procedure, the patient's blood pressure, pulse, and                            oxygen saturations were monitored continuously.  The                            Olympus Scope SN: [id number] was introduced through                            the anus and advanced to the the cecum, identified                            by appendiceal orifice and ileocecal valve. The                            colonoscopy was performed without difficulty. The                            patient tolerated the procedure well. The quality                            of the bowel preparation was good. The terminal                            ileum, ileocecal valve, appendiceal orifice, and                            rectum were photographed. The bowel preparation                            used was Miralax via split dose instruction. Scope In: 7:57:58 AM Scope Out: 8:12:59 AM Scope Withdrawal Time: 0 hours 11 minutes 17 seconds  Total Procedure Duration: 0 hours 15 minutes 1 second  Findings:                 Hemorrhoids were found on perianal exam.                           A patchy area of mildly altered vascular and                            erythematous mucosa was found in the rectum and in  the distal sigmoid colon. Biopsies were taken with                            a cold forceps for histology. Verification of                            patient identification for the specimen was done.                            Estimated blood loss was minimal.                           The terminal ileum appeared normal.                           The exam was otherwise without abnormality on                            direct and retroflexion views.                           Biopsies were taken with a cold forceps from the                            cecum+ ascending + transverse (jar1); descending                            and prox sigmoid (jar2); distal sigmoid + rectum                            (jar3) for ulcerative colitis surveillance. These                            biopsy specimens were sent to Pathology.                             Verification of patient identification for the                            specimen was done. random biopsies jar 1 and 2 x 10                            cm biopsies oher jars. Estimated blood loss was                            minimal. Complications:            No immediate complications. Estimated Blood Loss:     Estimated blood loss: none. Impression:               - Hemorrhoids found on perianal exam.                           - Altered vascular and erythematous mucosa in the  rectum and in the distal sigmoid colon. Biopsied as                            above (part of surveillance protocol). This is                            similar to 2023 exam.                           - The examined portion of the ileum was normal.                           - The examination was otherwise normal on direct                            and retroflexion views.                           - Biopsies for surveillance were taken from the                            cecum+ ascending = transverse (jar1); descending                            and prox sigmoid (jar2); distal. Recommendation:           - Patient has a contact number available for                            emergencies. The signs and symptoms of potential                            delayed complications were discussed with the                            patient. Return to normal activities tomorrow.                            Written discharge instructions were provided to the                            patient.                           - Resume previous diet.                           - Continue present medications. Anusol HC cream prn                            hemorrhoids Rxed today.                           - Await pathology results.                           -  Repeat colonoscopy is recommended for                            surveillance. The colonoscopy date will be                             determined after pathology results from today's                            exam become available for review. USE ABDOMINAL                            BINDER AND ADULT SCOPE AGAIN - HX REDUNDANT COLON                            AND THESE HELP Iva Boop, MD 10/02/2023 8:25:44 AM This report has been signed electronically.

## 2023-10-03 ENCOUNTER — Telehealth: Payer: Self-pay | Admitting: *Deleted

## 2023-10-03 NOTE — Telephone Encounter (Signed)
  Follow up Call-     10/02/2023    7:25 AM 08/09/2021    7:34 AM  Call back number  Post procedure Call Back phone  # [phone]  Permission to leave phone message Yes Yes     Patient questions:  Do you have a fever, pain , or abdominal swelling? No. Pain Score  0 *  Have you tolerated food without any problems? Yes.    Have you been able to return to your normal activities? Yes.    Do you have any questions about your discharge instructions: Diet   No. Medications  No. Follow up visit  No.  Do you have questions or concerns about your Care? No.  Actions: * If pain score is 4 or above: No action needed, pain <4.

## 2023-10-04 LAB — SURGICAL PATHOLOGY

## 2023-10-06 ENCOUNTER — Encounter: Payer: Self-pay | Admitting: Internal Medicine

## 2023-11-10 ENCOUNTER — Other Ambulatory Visit: Payer: Self-pay | Admitting: Internal Medicine

## 2023-11-15 ENCOUNTER — Ambulatory Visit: Payer: BC Managed Care – PPO | Admitting: Internal Medicine

## 2024-05-15 ENCOUNTER — Other Ambulatory Visit: Payer: Self-pay | Admitting: Internal Medicine

## 2024-08-05 ENCOUNTER — Encounter (INDEPENDENT_AMBULATORY_CARE_PROVIDER_SITE_OTHER): Payer: BC Managed Care – PPO | Admitting: Ophthalmology

## 2024-08-20 ENCOUNTER — Encounter (INDEPENDENT_AMBULATORY_CARE_PROVIDER_SITE_OTHER): Admitting: Ophthalmology
